# Patient Record
Sex: Female | Born: 1995 | Race: White | Hispanic: No | Marital: Single | Smoking: Current every day smoker
Health system: Southern US, Community
[De-identification: ages and names within clinical notes are randomized; demographics above are authoritative.]

---

## 2016-06-18 ENCOUNTER — Encounter (HOSPITAL_COMMUNITY): Payer: Self-pay

## 2016-06-18 ENCOUNTER — Emergency Department (HOSPITAL_COMMUNITY): Payer: Medicaid Other

## 2016-06-18 ENCOUNTER — Inpatient Hospital Stay (HOSPITAL_COMMUNITY): Payer: Medicaid Other

## 2016-06-18 ENCOUNTER — Inpatient Hospital Stay (HOSPITAL_COMMUNITY)
Admission: EM | Admit: 2016-06-18 | Discharge: 2016-06-23 | DRG: 853 | Discharge status: Against Medical Advice | Payer: Medicaid Other | Attending: Internal Medicine | Admitting: Internal Medicine

## 2016-06-18 DIAGNOSIS — F1990 Other psychoactive substance use, unspecified, uncomplicated: Secondary | ICD-10-CM | POA: Diagnosis not present

## 2016-06-18 DIAGNOSIS — E876 Hypokalemia: Secondary | ICD-10-CM | POA: Diagnosis present

## 2016-06-18 DIAGNOSIS — I959 Hypotension, unspecified: Secondary | ICD-10-CM

## 2016-06-18 DIAGNOSIS — R05 Cough: Secondary | ICD-10-CM | POA: Diagnosis present

## 2016-06-18 DIAGNOSIS — B1920 Unspecified viral hepatitis C without hepatic coma: Secondary | ICD-10-CM | POA: Diagnosis present

## 2016-06-18 DIAGNOSIS — D696 Thrombocytopenia, unspecified: Secondary | ICD-10-CM | POA: Diagnosis present

## 2016-06-18 DIAGNOSIS — A400 Sepsis due to streptococcus, group A: Principal | ICD-10-CM | POA: Diagnosis present

## 2016-06-18 DIAGNOSIS — R6520 Severe sepsis without septic shock: Secondary | ICD-10-CM | POA: Diagnosis not present

## 2016-06-18 DIAGNOSIS — A419 Sepsis, unspecified organism: Secondary | ICD-10-CM | POA: Diagnosis present

## 2016-06-18 DIAGNOSIS — B950 Streptococcus, group A, as the cause of diseases classified elsewhere: Secondary | ICD-10-CM | POA: Diagnosis not present

## 2016-06-18 DIAGNOSIS — E871 Hypo-osmolality and hyponatremia: Secondary | ICD-10-CM | POA: Diagnosis present

## 2016-06-18 DIAGNOSIS — R6521 Severe sepsis with septic shock: Secondary | ICD-10-CM | POA: Diagnosis present

## 2016-06-18 DIAGNOSIS — F17210 Nicotine dependence, cigarettes, uncomplicated: Secondary | ICD-10-CM | POA: Diagnosis not present

## 2016-06-18 DIAGNOSIS — M25421 Effusion, right elbow: Secondary | ICD-10-CM | POA: Diagnosis present

## 2016-06-18 DIAGNOSIS — L02413 Cutaneous abscess of right upper limb: Secondary | ICD-10-CM | POA: Diagnosis not present

## 2016-06-18 DIAGNOSIS — L03113 Cellulitis of right upper limb: Secondary | ICD-10-CM | POA: Diagnosis present

## 2016-06-18 DIAGNOSIS — F17200 Nicotine dependence, unspecified, uncomplicated: Secondary | ICD-10-CM | POA: Diagnosis present

## 2016-06-18 DIAGNOSIS — R21 Rash and other nonspecific skin eruption: Secondary | ICD-10-CM | POA: Diagnosis present

## 2016-06-18 DIAGNOSIS — Z5321 Procedure and treatment not carried out due to patient leaving prior to being seen by health care provider: Secondary | ICD-10-CM | POA: Diagnosis not present

## 2016-06-18 DIAGNOSIS — F1410 Cocaine abuse, uncomplicated: Secondary | ICD-10-CM | POA: Diagnosis present

## 2016-06-18 DIAGNOSIS — Z881 Allergy status to other antibiotic agents status: Secondary | ICD-10-CM | POA: Diagnosis not present

## 2016-06-18 DIAGNOSIS — Z880 Allergy status to penicillin: Secondary | ICD-10-CM

## 2016-06-18 DIAGNOSIS — F1910 Other psychoactive substance abuse, uncomplicated: Secondary | ICD-10-CM | POA: Diagnosis not present

## 2016-06-18 DIAGNOSIS — F1123 Opioid dependence with withdrawal: Secondary | ICD-10-CM | POA: Diagnosis present

## 2016-06-18 DIAGNOSIS — D649 Anemia, unspecified: Secondary | ICD-10-CM | POA: Diagnosis present

## 2016-06-18 DIAGNOSIS — M009 Pyogenic arthritis, unspecified: Secondary | ICD-10-CM | POA: Diagnosis not present

## 2016-06-18 DIAGNOSIS — M00221 Other streptococcal arthritis, right elbow: Secondary | ICD-10-CM | POA: Diagnosis present

## 2016-06-18 DIAGNOSIS — R233 Spontaneous ecchymoses: Secondary | ICD-10-CM

## 2016-06-18 DIAGNOSIS — Z452 Encounter for adjustment and management of vascular access device: Secondary | ICD-10-CM

## 2016-06-18 DIAGNOSIS — R7881 Bacteremia: Secondary | ICD-10-CM | POA: Diagnosis not present

## 2016-06-18 DIAGNOSIS — Z79899 Other long term (current) drug therapy: Secondary | ICD-10-CM | POA: Diagnosis not present

## 2016-06-18 LAB — I-STAT BETA HCG BLOOD, ED (MC, WL, AP ONLY)

## 2016-06-18 LAB — COMPREHENSIVE METABOLIC PANEL
ALT: 28 U/L (ref 14–54)
ANION GAP: 11 (ref 5–15)
AST: 44 U/L — ABNORMAL HIGH (ref 15–41)
Albumin: 2.3 g/dL — ABNORMAL LOW (ref 3.5–5.0)
Alkaline Phosphatase: 47 U/L (ref 38–126)
BUN: 5 mg/dL — ABNORMAL LOW (ref 6–20)
CHLORIDE: 88 mmol/L — AB (ref 101–111)
CO2: 26 mmol/L (ref 22–32)
Calcium: 7.7 mg/dL — ABNORMAL LOW (ref 8.9–10.3)
Creatinine, Ser: 0.83 mg/dL (ref 0.44–1.00)
Glucose, Bld: 112 mg/dL — ABNORMAL HIGH (ref 65–99)
POTASSIUM: 2.3 mmol/L — AB (ref 3.5–5.1)
Sodium: 125 mmol/L — ABNORMAL LOW (ref 135–145)
Total Bilirubin: 0.6 mg/dL (ref 0.3–1.2)
Total Protein: 5.7 g/dL — ABNORMAL LOW (ref 6.5–8.1)

## 2016-06-18 LAB — URINE MICROSCOPIC-ADD ON

## 2016-06-18 LAB — I-STAT CG4 LACTIC ACID, ED
LACTIC ACID, VENOUS: 2.22 mmol/L — AB (ref 0.5–1.9)
LACTIC ACID, VENOUS: 1.45 mmol/L (ref 0.5–1.9)

## 2016-06-18 LAB — URINALYSIS, ROUTINE W REFLEX MICROSCOPIC
Bilirubin Urine: NEGATIVE
Glucose, UA: NEGATIVE mg/dL
HGB URINE DIPSTICK: NEGATIVE
Ketones, ur: NEGATIVE mg/dL
Nitrite: NEGATIVE
Protein, ur: 100 mg/dL — AB
SPECIFIC GRAVITY, URINE: 1.019 (ref 1.005–1.030)
pH: 6 (ref 5.0–8.0)

## 2016-06-18 LAB — RAPID URINE DRUG SCREEN, HOSP PERFORMED
AMPHETAMINES: NOT DETECTED
BENZODIAZEPINES: NOT DETECTED
Barbiturates: NOT DETECTED
Cocaine: POSITIVE — AB
OPIATES: POSITIVE — AB
Tetrahydrocannabinol: NOT DETECTED

## 2016-06-18 LAB — CBC WITH DIFFERENTIAL/PLATELET
BASOS ABS: 0 10*3/uL (ref 0.0–0.1)
Basophils Relative: 0 %
EOS ABS: 0 10*3/uL (ref 0.0–0.7)
Eosinophils Relative: 0 %
HCT: 32.3 % — ABNORMAL LOW (ref 36.0–46.0)
HEMOGLOBIN: 11.1 g/dL — AB (ref 12.0–15.0)
LYMPHS ABS: 0.9 10*3/uL (ref 0.7–4.0)
Lymphocytes Relative: 10 %
MCH: 26.7 pg (ref 26.0–34.0)
MCHC: 34.4 g/dL (ref 30.0–36.0)
MCV: 77.8 fL — AB (ref 78.0–100.0)
Monocytes Absolute: 0.3 10*3/uL (ref 0.1–1.0)
Monocytes Relative: 4 %
NEUTROS PCT: 86 %
Neutro Abs: 7.8 10*3/uL — ABNORMAL HIGH (ref 1.7–7.7)
Platelets: 141 10*3/uL — ABNORMAL LOW (ref 150–400)
RBC: 4.15 MIL/uL (ref 3.87–5.11)
RDW: 13.4 % (ref 11.5–15.5)
WBC: 9 10*3/uL (ref 4.0–10.5)

## 2016-06-18 LAB — I-STAT TROPONIN, ED: TROPONIN I, POC: 0.04 ng/mL (ref 0.00–0.08)

## 2016-06-18 MED ORDER — DEXTROSE 5 % IV SOLN
2.0000 g | Freq: Once | INTRAVENOUS | Status: AC
  Administered 2016-06-18: 2 g via INTRAVENOUS
  Filled 2016-06-18: qty 2

## 2016-06-18 MED ORDER — DEXTROSE 5 % IV SOLN
1.0000 g | Freq: Three times a day (TID) | INTRAVENOUS | Status: DC
  Administered 2016-06-19: 1 g via INTRAVENOUS
  Filled 2016-06-18 (×3): qty 1

## 2016-06-18 MED ORDER — SODIUM CHLORIDE 0.9 % IV BOLUS (SEPSIS)
250.0000 mL | Freq: Once | INTRAVENOUS | Status: AC
  Administered 2016-06-18: 500 mL via INTRAVENOUS

## 2016-06-18 MED ORDER — SODIUM CHLORIDE 0.9 % IV BOLUS (SEPSIS)
1000.0000 mL | Freq: Once | INTRAVENOUS | Status: AC
  Administered 2016-06-18: 1000 mL via INTRAVENOUS

## 2016-06-18 MED ORDER — SODIUM CHLORIDE 0.9 % IV SOLN
30.0000 meq | Freq: Once | INTRAVENOUS | Status: AC
  Administered 2016-06-18: 30 meq via INTRAVENOUS
  Filled 2016-06-18: qty 15

## 2016-06-18 MED ORDER — NOREPINEPHRINE BITARTRATE 1 MG/ML IV SOLN
0.0000 ug/min | Freq: Once | INTRAVENOUS | Status: AC
  Administered 2016-06-19: 5 ug/min via INTRAVENOUS
  Filled 2016-06-18 (×2): qty 4

## 2016-06-18 MED ORDER — LEVOFLOXACIN IN D5W 750 MG/150ML IV SOLN
750.0000 mg | Freq: Once | INTRAVENOUS | Status: AC
  Administered 2016-06-18: 750 mg via INTRAVENOUS
  Filled 2016-06-18: qty 150

## 2016-06-18 MED ORDER — VANCOMYCIN HCL IN DEXTROSE 750-5 MG/150ML-% IV SOLN
750.0000 mg | Freq: Two times a day (BID) | INTRAVENOUS | Status: DC
  Filled 2016-06-18 (×2): qty 150

## 2016-06-18 MED ORDER — SODIUM CHLORIDE 0.9 % IV BOLUS (SEPSIS)
500.0000 mL | Freq: Once | INTRAVENOUS | Status: AC
  Administered 2016-06-18: 500 mL via INTRAVENOUS

## 2016-06-18 MED ORDER — LEVOFLOXACIN IN D5W 750 MG/150ML IV SOLN
750.0000 mg | INTRAVENOUS | Status: DC
  Filled 2016-06-18: qty 150

## 2016-06-18 MED ORDER — POTASSIUM CHLORIDE CRYS ER 20 MEQ PO TBCR
40.0000 meq | EXTENDED_RELEASE_TABLET | Freq: Once | ORAL | Status: AC
  Administered 2016-06-18: 40 meq via ORAL
  Filled 2016-06-18: qty 2

## 2016-06-18 MED ORDER — VANCOMYCIN HCL IN DEXTROSE 1-5 GM/200ML-% IV SOLN
1000.0000 mg | Freq: Once | INTRAVENOUS | Status: AC
  Administered 2016-06-18: 1000 mg via INTRAVENOUS
  Filled 2016-06-18: qty 200

## 2016-06-18 MED ORDER — IOPAMIDOL (ISOVUE-300) INJECTION 61%
INTRAVENOUS | Status: AC
  Administered 2016-06-18: 100 mL
  Filled 2016-06-18: qty 100

## 2016-06-18 MED ORDER — ACETAMINOPHEN 500 MG PO TABS
1000.0000 mg | ORAL_TABLET | Freq: Once | ORAL | Status: DC
  Filled 2016-06-18: qty 2

## 2016-06-18 MED ORDER — SODIUM CHLORIDE 0.9% FLUSH
3.0000 mL | Freq: Two times a day (BID) | INTRAVENOUS | Status: DC
  Administered 2016-06-19 – 2016-06-20 (×2): 3 mL via INTRAVENOUS

## 2016-06-18 MED ORDER — ENOXAPARIN SODIUM 40 MG/0.4ML ~~LOC~~ SOLN
40.0000 mg | SUBCUTANEOUS | Status: DC
  Administered 2016-06-20 – 2016-06-23 (×3): 40 mg via SUBCUTANEOUS
  Filled 2016-06-18 (×5): qty 0.4

## 2016-06-18 NOTE — ED Notes (Signed)
Lab called Potassium 2.3.  PA made aware.

## 2016-06-18 NOTE — ED Triage Notes (Signed)
To room via EMS.  Onset 1 week non productive cough.  Onset 1 week right forearm swelling and pain and reports started after shooting heroin.  Initially there was redness in this area, no redness noted now. Pt unable to straighten out right arm d/t pain.  Pt last used heroin 3 days ago.  Pt has had vomiting and diarrhea x 3 days, pt thinks this is due to withdrawals.  Onset 1 hour PTA widespread pink rash, slightly raised, and itching.  EMS gave Benadryl 50 mg - pt c/o no itching at this tim.  EMS gave Tylenol 1000 mg for temp 101.1 temporal.  Pt is homeless and reports she stayed with an elderly couple in their hotel room last night.

## 2016-06-18 NOTE — ED Notes (Signed)
[facility] contacted to activate Code Sepsis 

## 2016-06-18 NOTE — Consult Note (Signed)
PULMONARY / CRITICAL CARE MEDICINE   Name: Megan Farmer MRN: [id number] DOB: 28-Apr-1996    ADMISSION DATE:  06/18/2016 CONSULTATION DATE:  06/18/16  REFERRING MD:  ED physicican  CHIEF COMPLAINT:  Septic shock  HISTORY OF PRESENT ILLNESS:   Megan Farmer is a 20 y/o woman with a history of IV drug use, injects heroin and smokes cocaine.  Patient presents to the ED with complaint of right elbow pain, rash, cough and congestion, fever. Pt states approximately a week ago she injected right AC with crack cocaine "wash." She explains that is when a cotton is soaked and crack cocaine and vinegar, and then this mixture is drawn and injected. Patient states in her case this was old and she just had leftovers from a couple of days that she drew up and injected. She states since then she developed pain and swelling to the right elbow. She has pain with full extension of the joint. She is also reporting cough and congestion with green productive sputum for 1-2 weeks. She states today she broke out in a rash in her extremities. She believes she developed fever at some point today as well. She has been staying in a hotel with an elderly couple that picked her up that has dogs. States rash is itchy. She was given Benadryl by EMS prior to coming in. She reports no prior medical problems. She denies taking any medications prior to coming in.  Pt remained hypotensive despite 5L IV fluids and was eventually started on levophed and admitted to the ICU.   PAST MEDICAL HISTORY :  Previously healthy PAST SURGICAL HISTORY: No past surgery  Allergies  Allergen Reactions  . Amoxicillin Swelling  . Penicillins Swelling    No current facility-administered medications on file prior to encounter.    No current outpatient prescriptions on file prior to encounter.    FAMILY HISTORY:  Her has no family status information on file.    SOCIAL HISTORY: She  reports that she has been smoking.  She has never  used smokeless tobacco. She reports that she uses drugs, including IV and Cocaine. She reports that she does not drink alcohol.  REVIEW OF SYSTEMS:   A review of 14 systems was negative except as stated in the HPI.   SUBJECTIVE:  20 y/o with fluid refractory shock requiring pressors 2/2 soft tissue infection of the right arm.   VITAL SIGNS: BP (!) 78/48   Pulse 119   Temp 100.8 F (38.2 C) (Rectal)   Resp 25   Wt 54.4 kg (120 lb)   LMP 05/29/2016   SpO2 97%   HEMODYNAMICS:    VENTILATOR SETTINGS:    INTAKE / OUTPUT: No intake/output data recorded.  PHYSICAL EXAMINATION: General:  Laying in bed, no acute distress but quite anxious Neuro:  A&Ox3 HEENT:  PERRL, EOMI, OP clear Cardiovascular:  Tachycardic, regular rhythm, no MRG Lungs:  Scattered rhonchi Abdomen:  Soft, NTND, +BS Musculoskeletal:  No edema, right forearm swollen Skin:  Multiple bruises and injection sites.  LABS:  BMET  Recent Labs Lab 06/18/16 2000  NA 125*  K 2.3*  CL 88*  CO2 26  BUN 5*  CREATININE 0.83  GLUCOSE 112*    Electrolytes  Recent Labs Lab 06/18/16 2000  CALCIUM 7.7*    CBC  Recent Labs Lab 06/18/16 2000  WBC 9.0  HGB 11.1*  HCT 32.3*  PLT 141*    Coag's No results for input(s): APTT, INR in the last 168 hours.  Sepsis Markers  Recent Labs Lab 06/18/16 2006 06/18/16 2251  LATICACIDVEN 2.22* 1.45    ABG No results for input(s): PHART, PCO2ART, PO2ART in the last 168 hours.  Liver Enzymes  Recent Labs Lab 06/18/16 2000  AST 44*  ALT 28  ALKPHOS 47  BILITOT 0.6  ALBUMIN 2.3*    Cardiac Enzymes No results for input(s): TROPONINI, PROBNP in the last 168 hours.  Glucose No results for input(s): GLUCAP in the last 168 hours.  Imaging Dg Chest 2 View  Result Date: 06/18/2016 CLINICAL DATA:  Pt has had congestion with a productive cough w/yellowish phlegm. She was trying to inject crack intraveniously and missed her vein. She said her whole  elbow feels tight and she's unable to straighten her arm out. EXAM: CHEST  2 VIEW COMPARISON:  None. FINDINGS: The heart size and mediastinal contours are within normal limits. Both lungs are clear. No pleural effusion or pneumothorax. The visualized skeletal structures are unremarkable. IMPRESSION: No active cardiopulmonary disease. Electronically Signed   By: Amie Portlandavid  Ormond M.D.   On: 06/18/2016 21:17   Dg Elbow Complete Right  Result Date: 06/18/2016 CLINICAL DATA:  Pt has had congestion with a productive cough w/yellowish phlegm. She was trying to inject crack intraveniously and missed her vein. She said her whole elbow feels tight and she's unable to straighten her arm out. EXAM: RIGHT ELBOW - COMPLETE 3+ VIEW COMPARISON:  None. FINDINGS: No fracture.  No bone lesion. The elbow joint is normally spaced and aligned. No arthropathic change. No joint effusion. There is subcutaneous soft tissue edema/cellulitis most evident along the dorsal aspect of the elbow and proximal forearm. No soft tissue air. IMPRESSION: 1. No fracture, bone lesion or elbow joint abnormality. 2. Soft tissue edema/cellulitis. Electronically Signed   By: Amie Portlandavid  Ormond M.D.   On: 06/18/2016 21:18   Ct Eblow Right W Contrast  Result Date: 06/18/2016 CLINICAL DATA:  Acute onset of right elbow pain and swelling. Initial encounter. EXAM: CT OF THE UPPER RIGHT EXTREMITY WITH CONTRAST TECHNIQUE: Multidetector CT imaging of the upper right extremity was performed according to the standard protocol following intravenous contrast administration. COMPARISON:  None. CONTRAST:  100mL ISOVUE-300 IOPAMIDOL (ISOVUE-300) INJECTION 61% FINDINGS: Bones/Joint/Cartilage There is no evidence of fracture or dislocation. No osseous erosion is seen. Note is made of a right elbow joint effusion, with mild peripheral enhancement, raising suspicion for septic joint. Ligaments Suboptimally assessed by CT. Muscles and Tendons An apparent tiny 1.2 x 0.8 cm  intramuscular abscess is seen within the superficial musculature at the level of the antecubital fossa. The visualized musculature is otherwise grossly unremarkable. The visualized tendon structures are grossly unremarkable. Soft tissues Mild soft tissue edema is seen tracking along the dorsum of the forearm and elbow. There appears to be diffuse thrombophlebitis involving multiple veins at and above the antecubital fossa. IMPRESSION: 1. No evidence of fracture or dislocation. No evidence of osseous erosion. 2. Right elbow joint effusion, with mild peripheral enhancement, raising suspicion for septic joint. 3. Apparent tiny 1.2 cm intramuscular abscess within the superficial musculature at the level of the antecubital fossa. 4. Diffuse thrombophlebitis involving multiple veins at and above the antecubital fossa. 5. Mild soft tissue edema tracking along the dorsum of the forearm and elbow. Electronically Signed   By: Roanna RaiderJeffery  Chang M.D.   On: 06/18/2016 22:40     STUDIES:  CT right elbow - soft tissue swelling, small abscess, elbow joint effusion   CULTURES: Blood  ANTIBIOTICS: Aztreonam 06/18/16 --->  Vanc 06/18/16 --> Levaquin 06/18/16 -->  SIGNIFICANT EVENTS:   LINES/TUBES: Left subclavian TLC 06/19/16  DISCUSSION: 20 y/o IVDU with fluid refractory shock 2/2 soft tissue infection of right arm.  ASSESSMENT / PLAN:  PULMONARY A: Productive cough P:   No evidence of pna on CXR O2 by nasal canula to maintain sats >92%  CARDIOVASCULAR A:  Tachycardia, hypotension P:  2/2 sepsis and opioid withdrawal Levophed to maintain MAP >65  GASTROINTESTINAL A:   H/o vomiting  P:   Likely 2/2 withdrawal Treat symptomatically with antiemetics  HEMATOLOGIC A:   Anemia, thrombocytopenia P:  Likely 2/2 to infection Follow  INFECTIOUS A:   Soft tissue infection of right arm P:   Seen and evaluated by ortho - may go to OR today, they will re-evaluate this AM, currently arm is  elevated Vanc, Aztrenam and levaquin (penicillin allergy) pending culture results   FAMILY  - Updates: Pt does not want her family contacted at this time.  - Inter-disciplinary family meet or Palliative Care meeting due by:  06/25/16   Sandi Carne, MD, PhD Pulmonary and Critical Care Medicine [facility] Pager: [phone]  06/18/2016, 11:43 PM

## 2016-06-18 NOTE — Progress Notes (Signed)
Pharmacy Antibiotic Note  Megan Farmer is a 20 y.o. female admitted on 06/18/2016 with sepsis.  Pharmacy has been consulted for vancomycin, levaquin and aztreonam dosing. Pt with Tmax 103.2 and WBC is WNL. Scr is WNL. Lactic acid is elevated at 2.22.   Plan: - Vancomycin 1gm IV x 1 then 750mg  IV Q12H - Aztreonam 1gm IV Q8H - Levaquin 750mg  IV Q24H - F/u renal fxn, C&S, clinical status and trough at SS  Weight: 120 lb (54.4 kg)  Temp (24hrs), Avg:103.2 F (39.6 C), Min:103.2 F (39.6 C), Max:103.2 F (39.6 C)   Recent Labs Lab 06/18/16 2000 06/18/16 2006  WBC 9.0  --   CREATININE 0.83  --   LATICACIDVEN  --  2.22*    CrCl cannot be calculated (Unknown ideal weight.).    Allergies  Allergen Reactions  . Amoxicillin Swelling  . Penicillins Swelling    Antimicrobials this admission: Vanc 12/2>> Levaquin 12/2>> Aztreonam 12/2>>  Dose adjustments this admission: N/A  Microbiology results: Pending  Thank you for allowing pharmacy to be a part of this patient's care.  Rayen Dafoe, Drake LeachRachel Lynn 06/18/2016 8:48 PM

## 2016-06-18 NOTE — ED Provider Notes (Signed)
Patient complains of cough productive of green sputum for one week. She also complains of right elbow pain at the medial aspect and to fully extend elbow for the past 3 days since she injected into her right medial elbow 3 days ago. On exam patient is alert Glasgow Coma Score 15 ill-appearing skin diffuse petechial rash neck supple heart tachycardic regular rhythm no murmurs left upper extremity swollen and it elbow she is unable to extend past 90. Radial pulse 2+. Good capillary refill.   Doug SouSam Keyonta Madrid, MD 06/18/16 [phone]

## 2016-06-18 NOTE — ED Notes (Signed)
Report attempted 

## 2016-06-18 NOTE — ED Provider Notes (Signed)
[facility]-EMERGENCY DEPT Provider Note   CSN: [id number] Arrival date & time: 06/18/16  1937     History   Chief Complaint Chief Complaint  Patient presents with  . Cough  . Fever    HPI Megan Farmer is a 20 y.o. female.  HPI Megan Farmer is a 20 y.o. female with no medical problems, presents to ED with complaint of right elbow pain, rash, cough and congestion, fever. Pt states approximately a week ago she injected right AC with crack cocaine "wash." She explains that is when a cotton is soaked and crack cocaine and vinegar, and then this mixture is drawn and injected. Patient states in her case this was old and she just had leftovers from a couple of days that she drew up and injected. She states since then she developed pain and swelling to the right elbow. She has pain with full extension of the joint. She is also reporting cough and congestion with green productive sputum for 1-2 weeks. She states today she broke out in a rash in her extremities. She believes she developed fever at some point today as well. She has been staying in a hotel with an elderly couple that picked her up that has dogs. States rash is itchy. She was given Benadryl by EMS prior to coming in. She reports no prior medical problems. She denies taking any medications prior to coming in.  History reviewed. No pertinent past medical history.  There are no active problems to display for this patient.   History reviewed. No pertinent surgical history.  OB History    No data available       Home Medications    Prior to Admission medications   Not on File    Family History History reviewed. No pertinent family history.  Social History Social History  Substance Use Topics  . Smoking status: Current Every Day Smoker  . Smokeless tobacco: Never Used     Comment: Doesn't have money to buy, will smoke when someone gives them to her.   . Alcohol use No     Allergies   Amoxicillin and  Penicillins   Review of Systems Review of Systems  Constitutional: Positive for chills and fever.  Respiratory: Positive for cough. Negative for chest tightness and shortness of breath.   Cardiovascular: Negative for chest pain, palpitations and leg swelling.  Gastrointestinal: Negative for abdominal pain, diarrhea, nausea and vomiting.  Genitourinary: Negative for dysuria, flank pain, pelvic pain, vaginal bleeding, vaginal discharge and vaginal pain.  Musculoskeletal: Positive for arthralgias, joint swelling and myalgias. Negative for neck pain and neck stiffness.  Skin: Positive for rash.  Neurological: Negative for dizziness, weakness and headaches.  All other systems reviewed and are negative.    Physical Exam Updated Vital Signs BP 104/68   Pulse (!) 139   Temp (!) 103.2 F (39.6 C) (Rectal)   Resp 22   LMP 05/29/2016   SpO2 94%   Physical Exam  Constitutional: She appears well-developed and well-nourished. No distress.  HENT:  Head: Normocephalic.  Lips Dry, oral mucosa dry  Eyes: Conjunctivae are normal.  Neck: Neck supple.  Cardiovascular: Regular rhythm and normal heart sounds.   Tachycardic. No murmur  Pulmonary/Chest: Effort normal and breath sounds normal. No respiratory distress. She has no wheezes. She has no rales.  Abdominal: Soft. Bowel sounds are normal. She exhibits no distension. There is no tenderness. There is no rebound.  Musculoskeletal: She exhibits no edema.  Swelling diffusely to the right elbow.  Diffuse tenderness to palpation. No erythema or warmth to the touch. Patient is holding her right elbow in 90 flexed position. She is able to fully flex the elbow, unable to extend fully. No skin cellulitis noted.  Neurological: She is alert.  Skin: Skin is warm and dry.  Rash to bilateral upper and lower extremities. Rash to the left arm petechial, nonblanching, flat. The rash to bilateral legs and right arm looks slightly different, it is blanching,  erythematous, papular, raised.  Psychiatric: She has a normal mood and affect. Her behavior is normal.  Nursing note and vitals reviewed.    ED Treatments / Results  Labs (all labs ordered are listed, but only abnormal results are displayed) Labs Reviewed  COMPREHENSIVE METABOLIC PANEL - Abnormal; Notable for the following:       Result Value   Sodium 125 (*)    Potassium 2.3 (*)    Chloride 88 (*)    Glucose, Bld 112 (*)    BUN 5 (*)    Calcium 7.7 (*)    Total Protein 5.7 (*)    Albumin 2.3 (*)    AST 44 (*)    All other components within normal limits  CBC WITH DIFFERENTIAL/PLATELET - Abnormal; Notable for the following:    Hemoglobin 11.1 (*)    HCT 32.3 (*)    MCV 77.8 (*)    Platelets 141 (*)    Neutro Abs 7.8 (*)    All other components within normal limits  I-STAT CG4 LACTIC ACID, ED - Abnormal; Notable for the following:    Lactic Acid, Venous 2.22 (*)    All other components within normal limits  CULTURE, BLOOD (ROUTINE X 2)  CULTURE, BLOOD (ROUTINE X 2)  URINE CULTURE  URINALYSIS, ROUTINE W REFLEX MICROSCOPIC (NOT [facility])  RAPID URINE DRUG SCREEN, HOSP PERFORMED  I-STAT TROPOININ, ED  I-STAT BETA HCG BLOOD, ED ([facility], [facility], [facility] ONLY)    EKG  EKG Interpretation  Date/Time:  Saturday June 18 2016 19:47:42 EST Ventricular Rate:  144 PR Interval:    QRS Duration: 84 QT Interval:  311 QTC Calculation: 482 R Axis:   34 Text Interpretation:  Sinus tachycardia Ventricular premature complex Borderline repolarization abnormality Borderline prolonged QT interval No old tracing to compare Confirmed by Ethelda ChickJACUBOWITZ  MD, SAM [phone]([id number]) on 06/18/2016 8:10:22 PM       Radiology Dg Chest 2 View  Result Date: 06/18/2016 CLINICAL DATA:  Pt has had congestion with a productive cough w/yellowish phlegm. She was trying to inject crack intraveniously and missed her vein. She said her whole elbow feels tight and she's unable to straighten her arm out. EXAM: CHEST  2 VIEW  COMPARISON:  None. FINDINGS: The heart size and mediastinal contours are within normal limits. Both lungs are clear. No pleural effusion or pneumothorax. The visualized skeletal structures are unremarkable. IMPRESSION: No active cardiopulmonary disease. Electronically Signed   By: Amie Portlandavid  Ormond M.D.   On: 06/18/2016 21:17   Dg Elbow Complete Right  Result Date: 06/18/2016 CLINICAL DATA:  Pt has had congestion with a productive cough w/yellowish phlegm. She was trying to inject crack intraveniously and missed her vein. She said her whole elbow feels tight and she's unable to straighten her arm out. EXAM: RIGHT ELBOW - COMPLETE 3+ VIEW COMPARISON:  None. FINDINGS: No fracture.  No bone lesion. The elbow joint is normally spaced and aligned. No arthropathic change. No joint effusion. There is subcutaneous soft tissue edema/cellulitis most evident along the dorsal  aspect of the elbow and proximal forearm. No soft tissue air. IMPRESSION: 1. No fracture, bone lesion or elbow joint abnormality. 2. Soft tissue edema/cellulitis. Electronically Signed   By: Amie Portlandavid  Ormond M.D.   On: 06/18/2016 21:18    Procedures Procedures (including critical care time)  Medications Ordered in ED Medications  sodium chloride 0.9 % bolus 1,000 mL (1,000 mLs Intravenous New Bag/Given 06/18/16 2041)    And  sodium chloride 0.9 % bolus 500 mL (not administered)    And  sodium chloride 0.9 % bolus 250 mL (not administered)  levofloxacin (LEVAQUIN) IVPB 750 mg (750 mg Intravenous New Bag/Given 06/18/16 2128)  aztreonam (AZACTAM) 2 g in dextrose 5 % 50 mL IVPB (2 g Intravenous New Bag/Given 06/18/16 2133)  vancomycin (VANCOCIN) IVPB 1000 mg/200 mL premix (not administered)  acetaminophen (TYLENOL) tablet 1,000 mg (1,000 mg Oral Not Given 06/18/16 2131)  potassium chloride 30 mEq in sodium chloride 0.9 % 265 mL (KCL MULTIRUN) IVPB (30 mEq Intravenous Given 06/18/16 2121)  potassium chloride SA (K-DUR,KLOR-CON) CR tablet 40 mEq (not  administered)  vancomycin (VANCOCIN) IVPB 750 mg/150 ml premix (not administered)  levofloxacin (LEVAQUIN) IVPB 750 mg (not administered)  aztreonam (AZACTAM) 1 g in dextrose 5 % 50 mL IVPB (not administered)  iopamidol (ISOVUE-300) 61 % injection (not administered)     Initial Impression / Assessment and Plan / ED Course  I have reviewed the triage vital signs and the nursing notes.  Pertinent labs & imaging results that were available during my care of the patient were reviewed by me and considered in my medical decision making (see chart for details).  Clinical Course    8:37 PM  Patient in emergency department with fever, right elbow pain and swelling, rash, cough. She is febrile at 103.2, heart rate is in 130s. Patient meets SIRS criteria and code Sepsis will be called. Suspect the source of infection could be right elbow infection versus pneumonia versus endocarditis given petechial rash. Blood pressure at this time is 104/68. Respiratory rate is 22. Will give her fluids, and antibiotics, will monitor closely.  9:34 PM Chest x-ray negative. Right elbow soft tissue swelling on xray, possible cellulitis. Still question septic joint. Discussed with Dr. Sherrie MustacheJacubuwitz, will get CT elbow. Pt continues to be tachycardic, tachypnec, she is receiving fluids. Potassium 2.3, given 40mEq PO and 30mEq IV. She is receiving antibiotics.   Spoke with Dr. Julian ReilGardner with triad, will admit.   Sepsis - Repeat Assessment  Performed at:    9:38 PM  Vitals     Blood pressure 93/59, pulse (!) 131, temperature (!) 103.2 F (39.6 C), temperature source Rectal, resp. rate (!) 31, weight 54.4 kg, last menstrual period 05/29/2016, SpO2 94 %.  Heart:     Tachycardic  Lungs:    Wheezing  Capillary Refill:   <2 sec  Peripheral Pulse:   Radial pulse palpable and Dorsalis pedis pulse  palpable  Skin:     Normal Color   CRITICAL CARE Performed by: Jaynie CrumbleKIRICHENKO, Dalynn Jhaveri A Total critical care time: 30  minutes Critical care time was exclusive of separately billable procedures and treating other patients. Critical care was necessary to treat or prevent imminent or life-threatening deterioration. Critical care was time spent personally by me on the following activities: development of treatment plan with patient and/or surrogate as well as nursing, discussions with consultants, evaluation of patient's response to treatment, examination of patient, obtaining history from patient or surrogate, ordering and performing treatments and interventions, ordering and  review of laboratory studies, ordering and review of radiographic studies, pulse oximetry and re-evaluation of patient's condition.   Final Clinical Impressions(s) / ED  Diagnoses   Final diagnoses:  None    New Prescriptions New Prescriptions   No medications on file     Jaynie Crumble, PA-C 06/19/16 0505    Doug Sou, MD 06/20/16 1610

## 2016-06-18 NOTE — ED Notes (Signed)
Pt requesting that no one know that she is a patient at hospital and does not want to call anyone to advise that she is in hospital.

## 2016-06-18 NOTE — ED Notes (Signed)
Patient transported to CT 

## 2016-06-18 NOTE — Progress Notes (Signed)
BP drop to 80s despite ongoing initial IVF bolus: will add additional boluses as needed up to 6645ml/kg-60ml/kg at max and then will call PCCM for pressors at that point if she continues to deteriorate as I fear.

## 2016-06-18 NOTE — H&P (Addendum)
History and Physical    Megan Farmer ZOX:[id number] DOB: 03/09/1996 DOA: 06/18/2016   PCP: No primary care provider on file. Chief Complaint:  Chief Complaint  Patient presents with  . Cough  . Fever    HPI: Megan Farmer is a 20 y.o. female with medical history significant of IVDU, injects heroin and smokes cocaine.  Patient presents to the ED with complaint of right elbow pain, rash, cough and congestion, fever. Pt states approximately a week ago she injected right AC with crack cocaine "wash." She explains that is when a cotton is soaked and crack cocaine and vinegar, and then this mixture is drawn and injected. Patient states in her case this was old and she just had leftovers from a couple of days that she drew up and injected. She states since then she developed pain and swelling to the right elbow. She has pain with full extension of the joint. She is also reporting cough and congestion with green productive sputum for 1-2 weeks. She states today she broke out in a rash in her extremities. She believes she developed fever at some point today as well. She has been staying in a hotel with an elderly couple that picked her up that has dogs. States rash is itchy. She was given Benadryl by EMS prior to coming in. She reports no prior medical problems. She denies taking any medications prior to coming in.  ED Course: In the ED patient is clearly septic with Tm 103.2, tachycardic to 140s, has whole body peticheal rash.  Review of Systems: As per HPI otherwise 10 point review of systems negative.    History reviewed. No pertinent past medical history.  History reviewed. No pertinent surgical history.   reports that she has been smoking.  She has never used smokeless tobacco. She reports that she uses drugs, including IV and Cocaine. She reports that she does not drink alcohol.  Allergies  Allergen Reactions  . Amoxicillin Swelling  . Penicillins Swelling    History reviewed.  No pertinent family history.  Patient is estranged from family at this point due to drug use.   Prior to Admission medications   Not on File    Physical Exam: Vitals:   06/18/16 2040 06/18/16 2045 06/18/16 2145 06/18/16 2159  BP:  93/59 (!) 91/51   Pulse:  (!) 131 (!) 134   Resp:  (!) 31 (!) 28   Temp:    100.8 F (38.2 C)  TempSrc:    Rectal  SpO2:  94% 96%   Weight: 54.4 kg (120 lb)         Constitutional: NAD, calm, comfortable Eyes: PERRL, lids and conjunctivae normal ENMT: Mucous membranes are moist. Posterior pharynx clear of any exudate or lesions.Normal dentition.  Neck: normal, supple, no masses, no thyromegaly Respiratory: clear to auscultation bilaterally, no wheezing, no crackles. Normal respiratory effort. No accessory muscle use.  Cardiovascular: Regular rate and rhythm, no murmurs / rubs / gallops. No extremity edema. 2+ pedal pulses. No carotid bruits.  Abdomen: no tenderness, no masses palpated. No hepatosplenomegaly. Bowel sounds positive.  Musculoskeletal: no clubbing / cyanosis. No joint deformity upper and lower extremities. Good ROM, no contractures. Normal muscle tone.  Skin: whole body petechial rash Neurologic: CN 2-12 grossly intact. Sensation intact, DTR normal. Strength 5/5 in all 4.  Psychiatric: Normal judgment and insight. Alert and oriented x 3. Normal mood.    Labs on Admission: I have personally reviewed following labs and imaging studies  CBC:  Recent Labs Lab 06/18/16 2000  WBC 9.0  NEUTROABS 7.8*  HGB 11.1*  HCT 32.3*  MCV 77.8*  PLT 141*   Basic Metabolic Panel:  Recent Labs Lab 06/18/16 2000  NA 125*  K 2.3*  CL 88*  CO2 26  GLUCOSE 112*  BUN 5*  CREATININE 0.83  CALCIUM 7.7*   GFR: CrCl cannot be calculated (Unknown ideal weight.). Liver Function Tests:  Recent Labs Lab 06/18/16 2000  AST 44*  ALT 28  ALKPHOS 47  BILITOT 0.6  PROT 5.7*  ALBUMIN 2.3*   No results for input(s): LIPASE, AMYLASE in the  last 168 hours. No results for input(s): AMMONIA in the last 168 hours. Coagulation Profile: No results for input(s): INR, PROTIME in the last 168 hours. Cardiac Enzymes: No results for input(s): CKTOTAL, CKMB, CKMBINDEX, TROPONINI in the last 168 hours. BNP (last 3 results) No results for input(s): PROBNP in the last 8760 hours. HbA1C: No results for input(s): HGBA1C in the last 72 hours. CBG: No results for input(s): GLUCAP in the last 168 hours. Lipid Profile: No results for input(s): CHOL, HDL, LDLCALC, TRIG, CHOLHDL, LDLDIRECT in the last 72 hours. Thyroid Function Tests: No results for input(s): TSH, T4TOTAL, FREET4, T3FREE, THYROIDAB in the last 72 hours. Anemia Panel: No results for input(s): VITAMINB12, FOLATE, FERRITIN, TIBC, IRON, RETICCTPCT in the last 72 hours. Urine analysis: No results found for: COLORURINE, APPEARANCEUR, LABSPEC, PHURINE, GLUCOSEU, HGBUR, BILIRUBINUR, KETONESUR, PROTEINUR, UROBILINOGEN, NITRITE, LEUKOCYTESUR Sepsis Labs: @LABRCNTIP (procalcitonin:4,lacticidven:4) )No results found for this or any previous visit (from the past 240 hour(s)).   Radiological Exams on Admission: Dg Chest 2 View  Result Date: 06/18/2016 CLINICAL DATA:  Pt has had congestion with a productive cough w/yellowish phlegm. She was trying to inject crack intraveniously and missed her vein. She said her whole elbow feels tight and she's unable to straighten her arm out. EXAM: CHEST  2 VIEW COMPARISON:  None. FINDINGS: The heart size and mediastinal contours are within normal limits. Both lungs are clear. No pleural effusion or pneumothorax. The visualized skeletal structures are unremarkable. IMPRESSION: No active cardiopulmonary disease. Electronically Signed   By: Amie Portlandavid  Ormond M.D.   On: 06/18/2016 21:17   Dg Elbow Complete Right  Result Date: 06/18/2016 CLINICAL DATA:  Pt has had congestion with a productive cough w/yellowish phlegm. She was trying to inject crack intraveniously  and missed her vein. She said her whole elbow feels tight and she's unable to straighten her arm out. EXAM: RIGHT ELBOW - COMPLETE 3+ VIEW COMPARISON:  None. FINDINGS: No fracture.  No bone lesion. The elbow joint is normally spaced and aligned. No arthropathic change. No joint effusion. There is subcutaneous soft tissue edema/cellulitis most evident along the dorsal aspect of the elbow and proximal forearm. No soft tissue air. IMPRESSION: 1. No fracture, bone lesion or elbow joint abnormality. 2. Soft tissue edema/cellulitis. Electronically Signed   By: Amie Portlandavid  Ormond M.D.   On: 06/18/2016 21:18    EKG: Independently reviewed.  Assessment/Plan Principal Problem:   Sepsis associated hypotension (HCC) Active Problems:   IVDU (intravenous drug user)    1. Sepsis - likely bacteremia from IVDU, may also have R elbow infection 1. CT R elbow pending 2. IVF getting 30 ml/kg then 125 cc/hr 3. BCx ordered and pending, if positive, then likely needs TEE for endocarditis as she is high risk 4. Aztreonam, levaquin, and vanc empirically   DVT prophylaxis: Lovenox Code Status: Full Family Communication: No family in room Consults called: None  Admission status: Admit to inpatient   Hillary BowGARDNER, Zivah Mayr M. DO [facility] Pager [phone] from 7PM-7AM  If 7AM-7PM, please contact the day physician for the patient [email or web address] Password [email or web address]  06/18/2016, 10:04 PM

## 2016-06-18 NOTE — ED Notes (Signed)
Angela Burkealled Angela RN @ 4E to advise pt transport on hold per Dr. Julian ReilGardner, awaiting critical care consult.

## 2016-06-19 ENCOUNTER — Inpatient Hospital Stay (HOSPITAL_COMMUNITY): Payer: Medicaid Other

## 2016-06-19 ENCOUNTER — Inpatient Hospital Stay (HOSPITAL_COMMUNITY): Payer: Medicaid Other | Admitting: Anesthesiology

## 2016-06-19 ENCOUNTER — Encounter (HOSPITAL_COMMUNITY)
Admission: EM | Discharge status: Against Medical Advice | Payer: Self-pay | Source: Home / Self Care | Attending: Internal Medicine

## 2016-06-19 DIAGNOSIS — R6521 Severe sepsis with septic shock: Secondary | ICD-10-CM

## 2016-06-19 DIAGNOSIS — A419 Sepsis, unspecified organism: Secondary | ICD-10-CM | POA: Diagnosis present

## 2016-06-19 DIAGNOSIS — F1990 Other psychoactive substance use, unspecified, uncomplicated: Secondary | ICD-10-CM

## 2016-06-19 DIAGNOSIS — L03113 Cellulitis of right upper limb: Secondary | ICD-10-CM

## 2016-06-19 HISTORY — PX: I & D EXTREMITY: SHX5045

## 2016-06-19 LAB — CBC
HEMATOCRIT: 32 % — AB (ref 36.0–46.0)
HEMOGLOBIN: 10.7 g/dL — AB (ref 12.0–15.0)
MCH: 26 pg (ref 26.0–34.0)
MCHC: 33.4 g/dL (ref 30.0–36.0)
MCV: 77.9 fL — ABNORMAL LOW (ref 78.0–100.0)
Platelets: 107 10*3/uL — ABNORMAL LOW (ref 150–400)
RBC: 4.11 MIL/uL (ref 3.87–5.11)
RDW: 14 % (ref 11.5–15.5)
WBC: 8 10*3/uL (ref 4.0–10.5)

## 2016-06-19 LAB — MRSA PCR SCREENING: MRSA BY PCR: NEGATIVE

## 2016-06-19 LAB — BLOOD CULTURE ID PANEL (REFLEXED)
Acinetobacter baumannii: NOT DETECTED
CANDIDA GLABRATA: NOT DETECTED
CANDIDA KRUSEI: NOT DETECTED
CANDIDA PARAPSILOSIS: NOT DETECTED
Candida albicans: NOT DETECTED
Candida tropicalis: NOT DETECTED
ENTEROBACTER CLOACAE COMPLEX: NOT DETECTED
ESCHERICHIA COLI: NOT DETECTED
Enterobacteriaceae species: NOT DETECTED
Enterococcus species: NOT DETECTED
Haemophilus influenzae: NOT DETECTED
KLEBSIELLA OXYTOCA: NOT DETECTED
KLEBSIELLA PNEUMONIAE: NOT DETECTED
Listeria monocytogenes: NOT DETECTED
Neisseria meningitidis: NOT DETECTED
PROTEUS SPECIES: NOT DETECTED
PSEUDOMONAS AERUGINOSA: NOT DETECTED
SERRATIA MARCESCENS: NOT DETECTED
STAPHYLOCOCCUS AUREUS BCID: NOT DETECTED
STAPHYLOCOCCUS SPECIES: NOT DETECTED
STREPTOCOCCUS PNEUMONIAE: NOT DETECTED
Streptococcus agalactiae: NOT DETECTED
Streptococcus pyogenes: DETECTED — AB
Streptococcus species: DETECTED — AB

## 2016-06-19 LAB — BASIC METABOLIC PANEL
ANION GAP: 7 (ref 5–15)
BUN: 5 mg/dL — ABNORMAL LOW (ref 6–20)
CALCIUM: 7.4 mg/dL — AB (ref 8.9–10.3)
CO2: 22 mmol/L (ref 22–32)
Chloride: 105 mmol/L (ref 101–111)
Creatinine, Ser: 0.61 mg/dL (ref 0.44–1.00)
GFR calc non Af Amer: >60 mL/min (ref >60)
Glucose, Bld: 97 mg/dL (ref 65–99)
Potassium: 3.3 mmol/L — ABNORMAL LOW (ref 3.5–5.1)
Sodium: 134 mmol/L — ABNORMAL LOW (ref 135–145)

## 2016-06-19 LAB — GLUCOSE, CAPILLARY
GLUCOSE-CAPILLARY: 112 mg/dL — AB (ref 65–99)
Glucose-Capillary: 108 mg/dL — ABNORMAL HIGH (ref 65–99)

## 2016-06-19 LAB — HIV ANTIBODY (ROUTINE TESTING W REFLEX): HIV Screen 4th Generation wRfx: NONREACTIVE

## 2016-06-19 SURGERY — IRRIGATION AND DEBRIDEMENT EXTREMITY
Anesthesia: General | Site: Arm Lower | Laterality: Right

## 2016-06-19 MED ORDER — FENTANYL CITRATE (PF) 100 MCG/2ML IJ SOLN
INTRAMUSCULAR | Status: DC | PRN
  Administered 2016-06-19: 25 ug via INTRAVENOUS
  Administered 2016-06-19 (×2): 50 ug via INTRAVENOUS
  Administered 2016-06-19: 25 ug via INTRAVENOUS
  Administered 2016-06-19: 50 ug via INTRAVENOUS
  Administered 2016-06-19: 25 ug via INTRAVENOUS
  Administered 2016-06-19: 50 ug via INTRAVENOUS
  Administered 2016-06-19: 25 ug via INTRAVENOUS

## 2016-06-19 MED ORDER — SODIUM CHLORIDE 0.9% FLUSH
10.0000 mL | INTRAVENOUS | Status: DC | PRN
  Administered 2016-06-21: 10 mL
  Administered 2016-06-21: 20 mL
  Administered 2016-06-22: 10 mL
  Administered 2016-06-22: 20 mL
  Filled 2016-06-19 (×4): qty 40

## 2016-06-19 MED ORDER — FENTANYL CITRATE (PF) 100 MCG/2ML IJ SOLN
50.0000 ug | Freq: Once | INTRAMUSCULAR | Status: AC
  Administered 2016-06-19: 50 ug via INTRAVENOUS

## 2016-06-19 MED ORDER — DEXMEDETOMIDINE HCL IN NACL 200 MCG/50ML IV SOLN
INTRAVENOUS | Status: AC
  Filled 2016-06-19: qty 50

## 2016-06-19 MED ORDER — SUCCINYLCHOLINE CHLORIDE 200 MG/10ML IV SOSY
PREFILLED_SYRINGE | INTRAVENOUS | Status: AC
  Filled 2016-06-19: qty 10

## 2016-06-19 MED ORDER — SODIUM CHLORIDE 0.9 % IV SOLN
INTRAVENOUS | Status: AC
  Administered 2016-06-19: 11:00:00 via INTRAVENOUS

## 2016-06-19 MED ORDER — PROPOFOL 10 MG/ML IV BOLUS
INTRAVENOUS | Status: AC
  Filled 2016-06-19: qty 20

## 2016-06-19 MED ORDER — FENTANYL CITRATE (PF) 100 MCG/2ML IJ SOLN
INTRAMUSCULAR | Status: AC
  Filled 2016-06-19: qty 2

## 2016-06-19 MED ORDER — OXYCODONE HCL 5 MG PO TABS: 5.0000 mg | ORAL_TABLET | Freq: Once | ORAL | Status: DC | PRN

## 2016-06-19 MED ORDER — MIDAZOLAM HCL 5 MG/5ML IJ SOLN
INTRAMUSCULAR | Status: DC | PRN
  Administered 2016-06-19: 2 mg via INTRAVENOUS

## 2016-06-19 MED ORDER — DEXMEDETOMIDINE HCL 200 MCG/2ML IV SOLN
INTRAVENOUS | Status: DC | PRN
  Administered 2016-06-19: 16 ug via INTRAVENOUS
  Administered 2016-06-19: 8 ug via INTRAVENOUS
  Administered 2016-06-19: 16 ug via INTRAVENOUS
  Administered 2016-06-19 (×2): 8 ug via INTRAVENOUS

## 2016-06-19 MED ORDER — MIDAZOLAM HCL 2 MG/2ML IJ SOLN
INTRAMUSCULAR | Status: AC
  Administered 2016-06-19: 2 mg via INTRAVENOUS
  Filled 2016-06-19: qty 2

## 2016-06-19 MED ORDER — MIDAZOLAM HCL 2 MG/2ML IJ SOLN
INTRAMUSCULAR | Status: AC
  Filled 2016-06-19: qty 2

## 2016-06-19 MED ORDER — ONDANSETRON HCL 4 MG/2ML IJ SOLN
INTRAMUSCULAR | Status: AC
  Filled 2016-06-19: qty 2

## 2016-06-19 MED ORDER — FENTANYL CITRATE (PF) 100 MCG/2ML IJ SOLN
50.0000 ug | INTRAMUSCULAR | Status: DC | PRN
  Administered 2016-06-19: 50 ug via INTRAVENOUS
  Filled 2016-06-19: qty 2

## 2016-06-19 MED ORDER — FENTANYL CITRATE (PF) 100 MCG/2ML IJ SOLN
INTRAMUSCULAR | Status: AC
  Filled 2016-06-19: qty 4

## 2016-06-19 MED ORDER — SODIUM CHLORIDE 0.9 % IV SOLN
30.0000 meq | Freq: Once | INTRAVENOUS | Status: AC
  Administered 2016-06-19: 30 meq via INTRAVENOUS
  Filled 2016-06-19: qty 15

## 2016-06-19 MED ORDER — VANCOMYCIN HCL 500 MG IV SOLR
500.0000 mg | Freq: Three times a day (TID) | INTRAVENOUS | Status: DC
  Administered 2016-06-19 – 2016-06-20 (×4): 500 mg via INTRAVENOUS
  Filled 2016-06-19 (×6): qty 500

## 2016-06-19 MED ORDER — SODIUM CHLORIDE 0.9 % IR SOLN
Status: DC | PRN
  Administered 2016-06-19: 1000 mL

## 2016-06-19 MED ORDER — EPHEDRINE 5 MG/ML INJ
INTRAVENOUS | Status: AC
  Filled 2016-06-19: qty 10

## 2016-06-19 MED ORDER — NOREPINEPHRINE BITARTRATE 1 MG/ML IV SOLN
0.0000 ug/min | INTRAVENOUS | Status: DC
  Administered 2016-06-19: 10 ug/min via INTRAVENOUS
  Administered 2016-06-19: 14 ug via INTRAVENOUS
  Administered 2016-06-19: 14 ug/min via INTRAVENOUS
  Administered 2016-06-19: 9 ug/min via INTRAVENOUS
  Administered 2016-06-20: 2 ug/min via INTRAVENOUS
  Filled 2016-06-19 (×2): qty 4

## 2016-06-19 MED ORDER — MIDAZOLAM HCL 2 MG/2ML IJ SOLN
2.0000 mg | Freq: Once | INTRAMUSCULAR | Status: AC
  Administered 2016-06-19: 2 mg via INTRAVENOUS

## 2016-06-19 MED ORDER — DEXAMETHASONE SODIUM PHOSPHATE 10 MG/ML IJ SOLN
INTRAMUSCULAR | Status: AC
  Filled 2016-06-19: qty 2

## 2016-06-19 MED ORDER — ONDANSETRON HCL 4 MG/2ML IJ SOLN
INTRAMUSCULAR | Status: DC | PRN
  Administered 2016-06-19: 4 mg via INTRAVENOUS

## 2016-06-19 MED ORDER — CLINDAMYCIN PHOSPHATE 600 MG/50ML IV SOLN
600.0000 mg | Freq: Three times a day (TID) | INTRAVENOUS | Status: DC
  Administered 2016-06-19 – 2016-06-20 (×4): 600 mg via INTRAVENOUS
  Filled 2016-06-19 (×6): qty 50

## 2016-06-19 MED ORDER — FENTANYL CITRATE (PF) 100 MCG/2ML IJ SOLN
50.0000 ug | Freq: Once | INTRAMUSCULAR | Status: DC
Start: 2016-06-19 — End: 2016-06-23
  Filled 2016-06-19: qty 2

## 2016-06-19 MED ORDER — MIDAZOLAM HCL 2 MG/2ML IJ SOLN
2.0000 mg | Freq: Once | INTRAMUSCULAR | Status: AC
  Administered 2016-06-19: 2 mg via INTRAVENOUS
  Filled 2016-06-19: qty 2

## 2016-06-19 MED ORDER — MIDAZOLAM HCL 2 MG/2ML IJ SOLN
INTRAMUSCULAR | Status: AC
  Administered 2016-06-19: 2 mg
  Filled 2016-06-19: qty 2

## 2016-06-19 MED ORDER — PROPOFOL 10 MG/ML IV BOLUS
INTRAVENOUS | Status: DC | PRN
  Administered 2016-06-19: 20 mg via INTRAVENOUS

## 2016-06-19 MED ORDER — FENTANYL CITRATE (PF) 100 MCG/2ML IJ SOLN
50.0000 ug | INTRAMUSCULAR | Status: DC | PRN
  Administered 2016-06-19 – 2016-06-20 (×5): 50 ug via INTRAVENOUS
  Filled 2016-06-19 (×5): qty 2

## 2016-06-19 MED ORDER — FENTANYL CITRATE (PF) 100 MCG/2ML IJ SOLN: 25.0000 ug | INTRAMUSCULAR | Status: DC | PRN

## 2016-06-19 MED ORDER — DEXAMETHASONE SODIUM PHOSPHATE 10 MG/ML IJ SOLN
INTRAMUSCULAR | Status: DC | PRN
  Administered 2016-06-19: 10 mg via INTRAVENOUS

## 2016-06-19 MED ORDER — OXYCODONE HCL 5 MG/5ML PO SOLN: 5.0000 mg | Freq: Once | ORAL | Status: DC | PRN

## 2016-06-19 MED ORDER — ONDANSETRON HCL 4 MG/2ML IJ SOLN: 4.0000 mg | Freq: Three times a day (TID) | INTRAMUSCULAR | Status: DC | PRN

## 2016-06-19 MED ORDER — SODIUM CHLORIDE 0.9% FLUSH
10.0000 mL | Freq: Two times a day (BID) | INTRAVENOUS | Status: DC
  Administered 2016-06-19 – 2016-06-21 (×2): 10 mL

## 2016-06-19 MED ORDER — ONDANSETRON HCL 4 MG/2ML IJ SOLN: 4.0000 mg | Freq: Once | INTRAMUSCULAR | Status: DC | PRN

## 2016-06-19 MED ORDER — LORAZEPAM 2 MG/ML IJ SOLN
2.0000 mg | Freq: Four times a day (QID) | INTRAMUSCULAR | Status: DC | PRN
  Administered 2016-06-19 – 2016-06-23 (×11): 2 mg via INTRAVENOUS
  Filled 2016-06-19 (×12): qty 1

## 2016-06-19 MED ORDER — FENTANYL CITRATE (PF) 100 MCG/2ML IJ SOLN: 50.0000 ug | INTRAMUSCULAR | Status: DC | PRN

## 2016-06-19 MED ORDER — FENTANYL CITRATE (PF) 100 MCG/2ML IJ SOLN
INTRAMUSCULAR | Status: AC
  Administered 2016-06-19: 50 ug
  Filled 2016-06-19: qty 2

## 2016-06-19 MED ORDER — MIDAZOLAM HCL 2 MG/2ML IJ SOLN: 2.0000 mg | Freq: Once | INTRAMUSCULAR | Status: DC

## 2016-06-19 MED ORDER — LACTATED RINGERS IV SOLN
INTRAVENOUS | Status: DC | PRN
  Administered 2016-06-19: 08:00:00 via INTRAVENOUS

## 2016-06-19 SURGICAL SUPPLY — 52 items
BANDAGE ELASTIC 3 VELCRO ST LF (GAUZE/BANDAGES/DRESSINGS) ×3 IMPLANT
BANDAGE ELASTIC 4 VELCRO ST LF (GAUZE/BANDAGES/DRESSINGS) ×3 IMPLANT
BANDAGE ELASTIC 6 VELCRO ST LF (GAUZE/BANDAGES/DRESSINGS) ×3 IMPLANT
BLADE SURG 10 STRL SS (BLADE) ×3 IMPLANT
BNDG COHESIVE 4X5 TAN STRL (GAUZE/BANDAGES/DRESSINGS) ×3 IMPLANT
BNDG COHESIVE 6X5 TAN STRL LF (GAUZE/BANDAGES/DRESSINGS) ×6 IMPLANT
BNDG GAUZE ELAST 4 BULKY (GAUZE/BANDAGES/DRESSINGS) ×3 IMPLANT
COVER SURGICAL LIGHT HANDLE (MISCELLANEOUS) ×3 IMPLANT
CUFF TOURNIQUET SINGLE 18IN (TOURNIQUET CUFF) IMPLANT
CUFF TOURNIQUET SINGLE 34IN LL (TOURNIQUET CUFF) ×3 IMPLANT
DRAPE ORTHO SPLIT 77X108 STRL (DRAPES) ×4
DRAPE SURG 17X23 STRL (DRAPES) IMPLANT
DRAPE SURG ORHT 6 SPLT 77X108 (DRAPES) ×2 IMPLANT
DRAPE U-SHAPE 47X51 STRL (DRAPES) ×3 IMPLANT
DURAPREP 26ML APPLICATOR (WOUND CARE) ×3 IMPLANT
ELECT REM PT RETURN 9FT ADLT (ELECTROSURGICAL) ×3
ELECTRODE REM PT RTRN 9FT ADLT (ELECTROSURGICAL) ×1 IMPLANT
GAUZE SPONGE 4X4 12PLY STRL (GAUZE/BANDAGES/DRESSINGS) ×3 IMPLANT
GAUZE XEROFORM 1X8 LF (GAUZE/BANDAGES/DRESSINGS) ×3 IMPLANT
GLOVE BIO SURGEON STRL SZ8 (GLOVE) ×3 IMPLANT
GLOVE BIOGEL PI IND STRL 8 (GLOVE) ×2 IMPLANT
GLOVE BIOGEL PI INDICATOR 8 (GLOVE) ×4
GLOVE ORTHO TXT STRL SZ7.5 (GLOVE) ×3 IMPLANT
GOWN STRL REUS W/ TWL LRG LVL3 (GOWN DISPOSABLE) ×1 IMPLANT
GOWN STRL REUS W/ TWL XL LVL3 (GOWN DISPOSABLE) ×2 IMPLANT
GOWN STRL REUS W/TWL LRG LVL3 (GOWN DISPOSABLE) ×2
GOWN STRL REUS W/TWL XL LVL3 (GOWN DISPOSABLE) ×4
HANDPIECE INTERPULSE COAX TIP (DISPOSABLE)
KIT BASIN OR (CUSTOM PROCEDURE TRAY) ×3 IMPLANT
KIT ROOM TURNOVER OR (KITS) ×3 IMPLANT
MANIFOLD NEPTUNE II (INSTRUMENTS) ×3 IMPLANT
NS IRRIG 1000ML POUR BTL (IV SOLUTION) ×3 IMPLANT
PACK ORTHO EXTREMITY (CUSTOM PROCEDURE TRAY) ×3 IMPLANT
PAD ARMBOARD 7.5X6 YLW CONV (MISCELLANEOUS) ×6 IMPLANT
PADDING CAST ABS 4INX4YD NS (CAST SUPPLIES) ×4
PADDING CAST ABS COTTON 4X4 ST (CAST SUPPLIES) ×2 IMPLANT
PADDING CAST COTTON 6X4 STRL (CAST SUPPLIES) ×3 IMPLANT
SET HNDPC FAN SPRY TIP SCT (DISPOSABLE) IMPLANT
SPONGE GAUZE 4X4 12PLY STER LF (GAUZE/BANDAGES/DRESSINGS) ×3 IMPLANT
SPONGE LAP 18X18 X RAY DECT (DISPOSABLE) ×3 IMPLANT
STOCKINETTE IMPERVIOUS 9X36 MD (GAUZE/BANDAGES/DRESSINGS) ×3 IMPLANT
SUT ETHILON 2 0 FS 18 (SUTURE) IMPLANT
SUT ETHILON 3 0 PS 1 (SUTURE) ×6 IMPLANT
SUT VIC AB 2-0 SH 27 (SUTURE) ×2
SUT VIC AB 2-0 SH 27XBRD (SUTURE) ×1 IMPLANT
TOWEL OR 17X24 6PK STRL BLUE (TOWEL DISPOSABLE) ×3 IMPLANT
TOWEL OR 17X26 10 PK STRL BLUE (TOWEL DISPOSABLE) ×3 IMPLANT
TUBE ANAEROBIC SPECIMEN COL (MISCELLANEOUS) ×3 IMPLANT
TUBE CONNECTING 12'X1/4 (SUCTIONS) ×1
TUBE CONNECTING 12X1/4 (SUCTIONS) ×2 IMPLANT
UNDERPAD 30X30 (UNDERPADS AND DIAPERS) ×3 IMPLANT
YANKAUER SUCT BULB TIP NO VENT (SUCTIONS) ×3 IMPLANT

## 2016-06-19 NOTE — Consult Note (Signed)
PULMONARY / CRITICAL CARE MEDICINE   Name: [name] MRN: [id number] DOB: [birth year]    ADMISSION DATE:  06/18/2016 CONSULTATION DATE:  06/18/16  REFERRING MD:  ED physicican  CHIEF COMPLAINT:  Septic shock  HISTORY OF PRESENT ILLNESS:   [name] is a 20 y/o woman with a history of IV drug use, injects heroin and smokes cocaine.  Patient presents to the ED with complaint of right elbow pain, rash, cough and congestion, fever. Pt states approximately a week ago she injected right AC with crack cocaine "wash." She explains that is when a cotton is soaked and crack cocaine and vinegar, and then this mixture is drawn and injected. Patient states in her case this was old and she just had leftovers from a couple of days that she drew up and injected. She states since then she developed pain and swelling to the right elbow. She has pain with full extension of the joint. She is also reporting cough and congestion with green productive sputum for 1-2 weeks. She states today she broke out in a rash in her extremities. She believes she developed fever at some point today as well. She has been staying in a hotel with an elderly couple that picked her up that has dogs. States rash is itchy. She was given Benadryl by EMS prior to coming in. She reports no prior medical problems. She denies taking any medications prior to coming in.  Pt remained hypotensive despite 5L IV fluids and was eventually started on levophed and admitted to the ICU.   SUBJECTIVE:  No events overnight  VITAL SIGNS: BP 91/71   Pulse (!) 108   Temp 98.8 F (37.1 C) (Oral)   Resp (!) 30   Ht  (1.6 m)   Wt 56 kg (123 lb 7.3 oz)   LMP 05/29/2016   SpO2 100%   BMI 21.87 kg/m   HEMODYNAMICS:    VENTILATOR SETTINGS:    INTAKE / OUTPUT: I/O last 3 completed shifts: In: 5234.5 [I.V.:254.5; IV Piggyback:4980] Out: 1500 [Urine:975; Stool:525]  PHYSICAL EXAMINATION: General:  Laying in bed, no acute distress  but quite anxious Neuro:  A&Ox3, moving all ext to command HEENT:  PERRL, EOMI, OP clear Cardiovascular:  Tachycardic, regular rhythm, no MRG Lungs:  Scattered rhonchi Abdomen:  Soft, NTND, +BS Musculoskeletal:  No edema, right forearm swollen Skin:  Multiple bruises and injection sites.  LABS:  BMET  Recent Labs Lab 06/18/16 2000 06/19/16 0304  NA 125* 134*  K 2.3* 3.3*  CL 88* 105  CO2 26 22  BUN 5* 5*  CREATININE 0.83 0.61  GLUCOSE 112* 97   Electrolytes  Recent Labs Lab 06/18/16 2000 06/19/16 0304  CALCIUM 7.7* 7.4*   CBC  Recent Labs Lab 06/18/16 2000 06/19/16 0304  WBC 9.0 8.0  HGB 11.1* 10.7*  HCT 32.3* 32.0*  PLT 141* 107*   Coag's No results for input(s): APTT, INR in the last 168 hours.  Sepsis Markers  Recent Labs Lab 06/18/16 2006 06/18/16 2251  LATICACIDVEN 2.22* 1.45   ABG No results for input(s): PHART, PCO2ART, PO2ART in the last 168 hours.  Liver Enzymes  Recent Labs Lab 06/18/16 2000  AST 44*  ALT 28  ALKPHOS 47  BILITOT 0.6  ALBUMIN 2.3*   Cardiac Enzymes No results for input(s): TROPONINI, PROBNP in the last 168 hours.  Glucose  Recent Labs Lab 06/19/16 0139 06/19/16 0747  GLUCAP 112* 108*   Imaging Dg Chest 2 View  Result Date:  06/18/2016 CLINICAL DATA:  Pt has had congestion with a productive cough w/yellowish phlegm. She was trying to inject crack intraveniously and missed her vein. She said her whole elbow feels tight and she's unable to straighten her arm out. EXAM: CHEST  2 VIEW COMPARISON:  None. FINDINGS: The heart size and mediastinal contours are within normal limits. Both lungs are clear. No pleural effusion or pneumothorax. The visualized skeletal structures are unremarkable. IMPRESSION: No active cardiopulmonary disease. Electronically Signed   By: Amie Portlandavid  Ormond M.D.   On: 06/18/2016 21:17   Dg Elbow Complete Right  Result Date: 06/18/2016 CLINICAL DATA:  Pt has had congestion with a productive  cough w/yellowish phlegm. She was trying to inject crack intraveniously and missed her vein. She said her whole elbow feels tight and she's unable to straighten her arm out. EXAM: RIGHT ELBOW - COMPLETE 3+ VIEW COMPARISON:  None. FINDINGS: No fracture.  No bone lesion. The elbow joint is normally spaced and aligned. No arthropathic change. No joint effusion. There is subcutaneous soft tissue edema/cellulitis most evident along the dorsal aspect of the elbow and proximal forearm. No soft tissue air. IMPRESSION: 1. No fracture, bone lesion or elbow joint abnormality. 2. Soft tissue edema/cellulitis. Electronically Signed   By: Amie Portlandavid  Ormond M.D.   On: 06/18/2016 21:18   Ct Eblow Right W Contrast  Result Date: 06/18/2016 CLINICAL DATA:  Acute onset of right elbow pain and swelling. Initial encounter. EXAM: CT OF THE UPPER RIGHT EXTREMITY WITH CONTRAST TECHNIQUE: Multidetector CT imaging of the upper right extremity was performed according to the standard protocol following intravenous contrast administration. COMPARISON:  None. CONTRAST:  100mL ISOVUE-300 IOPAMIDOL (ISOVUE-300) INJECTION 61% FINDINGS: Bones/Joint/Cartilage There is no evidence of fracture or dislocation. No osseous erosion is seen. Note is made of a right elbow joint effusion, with mild peripheral enhancement, raising suspicion for septic joint. Ligaments Suboptimally assessed by CT. Muscles and Tendons An apparent tiny 1.2 x 0.8 cm intramuscular abscess is seen within the superficial musculature at the level of the antecubital fossa. The visualized musculature is otherwise grossly unremarkable. The visualized tendon structures are grossly unremarkable. Soft tissues Mild soft tissue edema is seen tracking along the dorsum of the forearm and elbow. There appears to be diffuse thrombophlebitis involving multiple veins at and above the antecubital fossa. IMPRESSION: 1. No evidence of fracture or dislocation. No evidence of osseous erosion. 2. Right  elbow joint effusion, with mild peripheral enhancement, raising suspicion for septic joint. 3. Apparent tiny 1.2 cm intramuscular abscess within the superficial musculature at the level of the antecubital fossa. 4. Diffuse thrombophlebitis involving multiple veins at and above the antecubital fossa. 5. Mild soft tissue edema tracking along the dorsum of the forearm and elbow. Electronically Signed   By: Roanna RaiderJeffery  Chang M.D.   On: 06/18/2016 22:40   Dg Chest Port 1 View  Result Date: 06/19/2016 CLINICAL DATA:  Central line placement.  Initial encounter. EXAM: PORTABLE CHEST 1 VIEW COMPARISON:  Chest radiograph performed 06/18/2016 FINDINGS: The lungs are well-aerated. Vascular congestion is noted. There is no evidence of focal opacification, pleural effusion or pneumothorax. The cardiomediastinal silhouette is within normal limits. No acute osseous abnormalities are seen. A left subclavian line is noted ending about the mid SVC. IMPRESSION: Vascular congestion noted.  Lungs remain grossly clear. Electronically Signed   By: Roanna RaiderJeffery  Chang M.D.   On: 06/19/2016 04:21   STUDIES:  CT right elbow - soft tissue swelling, small abscess, elbow joint effusion   CULTURES:  Blood  ANTIBIOTICS: Aztreonam 06/18/16 ---> Vanc 06/18/16 --> Levaquin 06/18/16 -->  SIGNIFICANT EVENTS: 12/3>>>to the OR for I&D  LINES/TUBES: Left subclavian TLC 06/19/16  DISCUSSION: 20 y/o IVDU with fluid refractory shock 2/2 soft tissue infection of right arm.  ASSESSMENT / PLAN:  PULMONARY A: Productive cough P:   No evidence of pna on CXR O2 by nasal canula to maintain sats >92%  CARDIOVASCULAR A:  Tachycardia, hypotension P:  2/2 sepsis and opioid withdrawal Levophed to maintain MAP >65  GASTROINTESTINAL A:   H/o vomiting  P:   Likely 2/2 withdrawal Treat symptomatically with antiemetics  HEMATOLOGIC A:   Anemia, thrombocytopenia P:  Likely 2/2 to infection Follow  INFECTIOUS A:   Soft tissue  infection of right arm P:   Seen and evaluated by ortho - going to the OR today for I&D per ortho Vanc, Aztrenam and levaquin (penicillin allergy) pending culture results  FAMILY  - Updates: Patient updated bedside.  - Inter-disciplinary family meet or Palliative Care meeting due by:  06/25/16  The patient is critically ill with multiple organ systems failure and requires high complexity decision making for assessment and support, frequent evaluation and titration of therapies, application of advanced monitoring technologies and extensive interpretation of multiple databases.   Critical Care Time devoted to patient care services described in this note is  35  Minutes. This time reflects time of care of this signee Dr Koren Bound. This critical care time does not reflect procedure time, or teaching time or supervisory time of PA/NP/Med student/Med Resident etc but could involve care discussion time.  Alyson Reedy, M.D. [facility] Pulmonary/Critical Care Medicine. Pager: [phone]. After hours pager: [phone].  06/19/2016, 8:54 AM

## 2016-06-19 NOTE — Progress Notes (Signed)
[facility] FOR AM LAB REPLACEMENT ONLY  The patient does [facility] Protocol based on the criteria listed below:   1. Is GFR >/= 40 ml/min? Yes.    Patient's GFR today is >60 2. Is urine output >/= 0.5 ml/kg/hr for the last 6 hours? Yes.   Patient's UOP is 1.0 ml/kg/hr 3. Is BUN < 60 mg/dL? Yes.    Patient's BUN today is 5 4. Abnormal electrolyte(s):K 3.3 5. Ordered repletion with: protocol 6. If a panic level lab has been reported, has the CCM MD in charge been notified? No..   Physician:    Markus DaftWHELAN, Fahim Kats A 06/19/2016 5:40 AM

## 2016-06-19 NOTE — Progress Notes (Signed)
Orthopedic Tech Progress Note Patient Details:  Awanda MinkLindsey XXXPatterson [birth year] [id number]  Ortho Devices Type of Ortho Device: Other (comment) Ortho Device/Splint Location: mission sling Ortho Device/Splint Interventions: Ordered, Application Applied as per drs verbal order  Trinna PostMartinez, Tymika Grilli J 06/19/2016, 2:50 AM

## 2016-06-19 NOTE — Consult Note (Signed)
Reason for Consult:  Right septic elbow; antecubital fossa abscess Referring Physician:   Critical Care Specialists  Megan Farmer is an 20 y.o. female.  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 I was consulted by the critical care specialists to evaluate her right arm due to questionable septic elbow joint. A CT scan has been obtained of the right elbow which shows fluid enhancement in the antecubital fossa area and very small abscess. There is a mild effusion of the elbow joint itself that is likely reactive edema. The patient does report pain in her "arm crease". She is septic appearing.   History reviewed. No pertinent past medical history.  History reviewed. No pertinent surgical history.  History reviewed. No pertinent family history.  Social History:  reports that she has been smoking.  She has never used smokeless tobacco. She reports that she uses drugs, including IV and Cocaine. She reports that she does not drink alcohol.  Allergies:  Allergies  Allergen Reactions  . Amoxicillin Swelling  . Penicillins Swelling    Medications: I have reviewed the patient's current medications.  Results for orders placed or performed during the hospital encounter of 06/18/16 (from the past 48 hour(s))  Comprehensive metabolic panel     Status: Abnormal   Collection Time: 06/18/16  8:00 PM  Result Value Ref Range   Sodium 125 (L) 135 - 145 mmol/L   Potassium 2.3 (LL) 3.5 - 5.1 mmol/L    Comment: CRITICAL RESULT CALLED TO, READ BACK BY AND VERIFIED WITH: RN C PRICE AT [id number] [id number] MARTINB    Chloride 88 (L) 101 - 111 mmol/L   CO2 26 22 - 32 mmol/L   Glucose, Bld 112 (H) 65 - 99 mg/dL   BUN 5 (L) 6 - 20 mg/dL   Creatinine, Ser 0.83 0.44 - 1.00 mg/dL   Calcium 7.7 (L) 8.9 - 10.3 mg/dL   Total Protein 5.7 (L) 6.5 - 8.1 g/dL   Albumin 2.3 (L) 3.5 - 5.0 g/dL   AST 44 (H) 15 - 41 U/L   ALT 28 14 - 54 U/L    Alkaline Phosphatase 47 38 - 126 U/L   Total Bilirubin 0.6 0.3 - 1.2 mg/dL   GFR calc non Af Amer >60 >60 mL/min   GFR calc Af Amer >60 >60 mL/min    Comment: (NOTE) The eGFR has been calculated using the CKD EPI equation. This calculation has not been validated in all clinical situations. eGFR's persistently <60 mL/min signify possible Chronic Kidney Disease.    Anion gap 11 5 - 15  CBC with Differential     Status: Abnormal   Collection Time: 06/18/16  8:00 PM  Result Value Ref Range   WBC 9.0 4.0 - 10.5 K/uL   RBC 4.15 3.87 - 5.11 MIL/uL   Hemoglobin 11.1 (L) 12.0 - 15.0 g/dL   HCT 32.3 (L) 36.0 - 46.0 %   MCV 77.8 (L) 78.0 - 100.0 fL   MCH 26.7 26.0 - 34.0 pg   MCHC 34.4 30.0 - 36.0 g/dL   RDW 13.4 11.5 - 15.5 %   Platelets 141 (L) 150 - 400 K/uL   Neutrophils Relative % 86 %   Neutro Abs 7.8 (H) 1.7 - 7.7 K/uL   Lymphocytes Relative 10 %   Lymphs Abs 0.9 0.7 - 4.0 K/uL   Monocytes Relative 4 %   Monocytes Absolute 0.3 0.1 - 1.0 K/uL  Eosinophils Relative 0 %   Eosinophils Absolute 0.0 0.0 - 0.7 K/uL   Basophils Relative 0 %   Basophils Absolute 0.0 0.0 - 0.1 K/uL  Urinalysis, Routine w reflex microscopic     Status: Abnormal   Collection Time: 06/18/16  8:01 PM  Result Value Ref Range   Color, Urine YELLOW YELLOW   APPearance CLOUDY (A) CLEAR   Specific Gravity, Urine 1.019 1.005 - 1.030   pH 6.0 5.0 - 8.0   Glucose, UA NEGATIVE NEGATIVE mg/dL   Hgb urine dipstick NEGATIVE NEGATIVE   Bilirubin Urine NEGATIVE NEGATIVE   Ketones, ur NEGATIVE NEGATIVE mg/dL   Protein, ur 100 (A) NEGATIVE mg/dL   Nitrite NEGATIVE NEGATIVE   Leukocytes, UA SMALL (A) NEGATIVE  Urine microscopic-add on     Status: Abnormal   Collection Time: 06/18/16  8:01 PM  Result Value Ref Range   Squamous Epithelial / LPF 6-30 (A) NONE SEEN   WBC, UA 6-30 0 - 5 WBC/hpf   RBC / HPF 0-5 0 - 5 RBC/hpf   Bacteria, UA MANY (A) NONE SEEN   Casts HYALINE CASTS (A) NEGATIVE   Urine-Other MUCOUS  PRESENT   I-Stat CG4 Lactic Acid, ED     Status: Abnormal   Collection Time: 06/18/16  8:06 PM  Result Value Ref Range   Lactic Acid, Venous 2.22 (HH) 0.5 - 1.9 mmol/L   Comment NOTIFIED PHYSICIAN   I-Stat Troponin, ED (not at [facility])     Status: None   Collection Time: 06/18/16  8:41 PM  Result Value Ref Range   Troponin i, poc 0.04 0.00 - 0.08 ng/mL   Comment 3            Comment: Due to the release kinetics of cTnI, a negative result within the first hours of the onset of symptoms does not rule out myocardial infarction with certainty. If myocardial infarction is still suspected, repeat the test at appropriate intervals.   I-Stat Beta hCG blood, ED ([facility], [facility], [facility] only)     Status: None   Collection Time: 06/18/16  8:41 PM  Result Value Ref Range   I-stat hCG, quantitative <5.0 <5 mIU/mL   Comment 3            Comment:   GEST. AGE      CONC.  (mIU/mL)   <=1 WEEK        5 - 50     2 WEEKS       50 - 500     3 WEEKS       100 - 10,000     4 WEEKS     1,000 - 30,000        FEMALE AND NON-PREGNANT FEMALE:     LESS THAN 5 mIU/mL   Rapid urine drug screen (hospital performed)     Status: Abnormal   Collection Time: 06/18/16  8:47 PM  Result Value Ref Range   Opiates POSITIVE (A) NONE DETECTED   Cocaine POSITIVE (A) NONE DETECTED   Benzodiazepines NONE DETECTED NONE DETECTED   Amphetamines NONE DETECTED NONE DETECTED   Tetrahydrocannabinol NONE DETECTED NONE DETECTED   Barbiturates NONE DETECTED NONE DETECTED    Comment:        DRUG SCREEN FOR MEDICAL PURPOSES ONLY.  IF CONFIRMATION IS NEEDED FOR ANY PURPOSE, NOTIFY LAB WITHIN 5 DAYS.        LOWEST DETECTABLE LIMITS FOR URINE DRUG SCREEN Drug Class       Cutoff (ng/mL)  Amphetamine      1000 Barbiturate      200 Benzodiazepine   177 Tricyclics       939 Opiates          300 Cocaine          300 THC              50   I-Stat CG4 Lactic Acid, ED     Status: None   Collection Time: 06/18/16 10:51 PM  Result Value Ref Range    Lactic Acid, Venous 1.45 0.5 - 1.9 mmol/L  Glucose, capillary     Status: Abnormal   Collection Time: 06/19/16  1:39 AM  Result Value Ref Range   Glucose-Capillary 112 (H) 65 - 99 mg/dL   Comment 1 Notify RN     Dg Chest 2 View  Result Date: 06/18/2016 CLINICAL DATA:  Pt has had congestion with a productive cough w/yellowish phlegm. She was trying to inject crack intraveniously and missed her vein. She said her whole elbow feels tight and she's unable to straighten her arm out. EXAM: CHEST  2 VIEW COMPARISON:  None. FINDINGS: The heart size and mediastinal contours are within normal limits. Both lungs are clear. No pleural effusion or pneumothorax. The visualized skeletal structures are unremarkable. IMPRESSION: No active cardiopulmonary disease. Electronically Signed   By: Lajean Manes M.D.   On: 06/18/2016 21:17   Dg Elbow Complete Right  Result Date: 06/18/2016 CLINICAL DATA:  Pt has had congestion with a productive cough w/yellowish phlegm. She was trying to inject crack intraveniously and missed her vein. She said her whole elbow feels tight and she's unable to straighten her arm out. EXAM: RIGHT ELBOW - COMPLETE 3+ VIEW COMPARISON:  None. FINDINGS: No fracture.  No bone lesion. The elbow joint is normally spaced and aligned. No arthropathic change. No joint effusion. There is subcutaneous soft tissue edema/cellulitis most evident along the dorsal aspect of the elbow and proximal forearm. No soft tissue air. IMPRESSION: 1. No fracture, bone lesion or elbow joint abnormality. 2. Soft tissue edema/cellulitis. Electronically Signed   By: Lajean Manes M.D.   On: 06/18/2016 21:18   Ct Eblow Right W Contrast  Result Date: 06/18/2016 CLINICAL DATA:  Acute onset of right elbow pain and swelling. Initial encounter. EXAM: CT OF THE UPPER RIGHT EXTREMITY WITH CONTRAST TECHNIQUE: Multidetector CT imaging of the upper right extremity was performed according to the standard protocol following  intravenous contrast administration. COMPARISON:  None. CONTRAST:  147m ISOVUE-300 IOPAMIDOL (ISOVUE-300) INJECTION 61% FINDINGS: Bones/Joint/Cartilage There is no evidence of fracture or dislocation. No osseous erosion is seen. Note is made of a right elbow joint effusion, with mild peripheral enhancement, raising suspicion for septic joint. Ligaments Suboptimally assessed by CT. Muscles and Tendons An apparent tiny 1.2 x 0.8 cm intramuscular abscess is seen within the superficial musculature at the level of the antecubital fossa. The visualized musculature is otherwise grossly unremarkable. The visualized tendon structures are grossly unremarkable. Soft tissues Mild soft tissue edema is seen tracking along the dorsum of the forearm and elbow. There appears to be diffuse thrombophlebitis involving multiple veins at and above the antecubital fossa. IMPRESSION: 1. No evidence of fracture or dislocation. No evidence of osseous erosion. 2. Right elbow joint effusion, with mild peripheral enhancement, raising suspicion for septic joint. 3. Apparent tiny 1.2 cm intramuscular abscess within the superficial musculature at the level of the antecubital fossa. 4. Diffuse thrombophlebitis involving multiple veins at and above the antecubital  fossa. 5. Mild soft tissue edema tracking along the dorsum of the forearm and elbow. Electronically Signed   By: Garald Balding M.D.   On: 06/18/2016 22:40    ROS Blood pressure (!) 88/65, pulse (!) 111, temperature 98.9 F (37.2 C), temperature source Oral, resp. rate (!) 31, height '5\' 2"'  (1.575 m), weight 120 lb (54.4 kg), last menstrual period 05/29/2016, SpO2 100 %. Physical Exam She is alert and oriented 3 and follows commands but is septic appearing.  Her right arm shows pain to palpation in the antecubital fossa and some of the proximal volar forearm muscles. Right now the compartments are soft. There is no area of fluctuance. There is no erythema. There is a slight elbow  effusion, but there is no pain to palpation around the elbow joint posteriorly at all. She does move her fingers and thumb and her right hand is well-perfused. There is pain in the forearm when I extend her fingers myself. She reports normal sensation over her hand.    Assessment/Plan: Right arm small antecubital fossa abscess with reactive elbow joint effusion post IV drug use.  I have recommended surgery with irrigation and debridement of her antecubital fossa abscess from where she injected drugs. However right now she is reluctant to provide informed consent for the surgery. She says that her right arm doesn't hurt that bad and she is sick from elsewhere.  I do not feel at all that this is a septic elbow joint. With my unfortunate experience of dealing with abscesses such as this multiple times a year, I've never experienced a septic elbow joint from IV drug use in the antecubital area. The elbow effusion itself is usually reactive edema from the soft-tissue insult. Her pain is definitely from the soft tissue infection and associated edema.  I'm not concerned about the elbow joint itself.  For now I'll placed her right arm and an elevator sling to decrease the edema. She should remain NPO for now. Fortunately she does not appear to have compartment syndrome which is often the case from IV drug abuse in this area of the arm. The abscess itself is very small and I can not palpate any fluctuance.  She wants to see how she does with IV antibiotics. Certainly she is septic appearing and I have explained this to her. I'll reevaluate her again over the next few hours.    Mcarthur Rossetti 06/19/2016, 2:45 AM

## 2016-06-19 NOTE — Progress Notes (Signed)
Pharmacy Antibiotic Note  Megan Farmer is a 20 y.o. female admitted on 06/18/2016 with sepsis.  Pharmacy has been consulted for vancomycin and changing levaquin and aztreonam to clindamycin. Pt with Tmax 103.2 and WBC is WNL. Scr is WNL and decreased from admission. Lactic acid has normalzied  Plan: - Change vancomycin to 500mg  IV Q8 - Clindamycin 600mg  IV Q8H - F/u renal fxn, C&S, clinical status and trough at SS  Height: 5\' 3"  (160 cm) Weight: 123 lb 7.3 oz (56 kg) IBW/kg (Calculated) : 52.4  Temp (24hrs), Avg:99.7 F (37.6 C), Min:97.9 F (36.6 C), Max:103.2 F (39.6 C)   Recent Labs Lab 06/18/16 2000 06/18/16 2006 06/18/16 2251 06/19/16 0304  WBC 9.0  --   --  8.0  CREATININE 0.83  --   --  0.61  LATICACIDVEN  --  2.22* 1.45  --     Estimated Creatinine Clearance: 91.2 mL/min (by C-G formula based on SCr of 0.61 mg/dL).    Allergies  Allergen Reactions  . Amoxicillin Swelling  . Penicillins Swelling    Antimicrobials this admission: Vanc 12/2>> Clinda 12/3>> Levaquin 12/2>>12/3 Aztreonam 12/2>>12/3  Dose adjustments this admission: Changed vanc to 500mg  Q8H on 12/3  Microbiology results: 12/2 BCID - strep pyogenes 12/2 Blood - GPC in chains  12/3 MRSA - NEG  Thank you for allowing pharmacy to be a part of this patient's care.  Megan Farmer, Megan Farmer 06/19/2016 11:05 AM

## 2016-06-19 NOTE — Progress Notes (Signed)
PHARMACY - PHYSICIAN COMMUNICATION CRITICAL VALUE ALERT - BLOOD CULTURE IDENTIFICATION (BCID)  Results for orders placed or performed during the hospital encounter of 06/18/16  Blood Culture ID Panel (Reflexed) (Collected: 06/18/2016  8:12 PM)  Result Value Ref Range   Enterococcus species NOT DETECTED NOT DETECTED   Listeria monocytogenes NOT DETECTED NOT DETECTED   Staphylococcus species NOT DETECTED NOT DETECTED   Staphylococcus aureus NOT DETECTED NOT DETECTED   Streptococcus species DETECTED (A) NOT DETECTED   Streptococcus agalactiae NOT DETECTED NOT DETECTED   Streptococcus pneumoniae NOT DETECTED NOT DETECTED   Streptococcus pyogenes DETECTED (A) NOT DETECTED   Acinetobacter baumannii NOT DETECTED NOT DETECTED   Enterobacteriaceae species NOT DETECTED NOT DETECTED   Enterobacter cloacae complex NOT DETECTED NOT DETECTED   Escherichia coli NOT DETECTED NOT DETECTED   Klebsiella oxytoca NOT DETECTED NOT DETECTED   Klebsiella pneumoniae NOT DETECTED NOT DETECTED   Proteus species NOT DETECTED NOT DETECTED   Serratia marcescens NOT DETECTED NOT DETECTED   Haemophilus influenzae NOT DETECTED NOT DETECTED   Neisseria meningitidis NOT DETECTED NOT DETECTED   Pseudomonas aeruginosa NOT DETECTED NOT DETECTED   Candida albicans NOT DETECTED NOT DETECTED   Candida glabrata NOT DETECTED NOT DETECTED   Candida krusei NOT DETECTED NOT DETECTED   Candida parapsilosis NOT DETECTED NOT DETECTED   Candida tropicalis NOT DETECTED NOT DETECTED    Name of physician (or Provider) Contacted: Yacoub  Changes to prescribed antibiotics required: Continue vancomycin due to penicillin allergy, DC levaquin and aztreonam and change to clindamycin  Tadao Emig, Drake LeachRachel Lynn 06/19/2016  10:56 AM

## 2016-06-19 NOTE — Anesthesia Preprocedure Evaluation (Addendum)
Anesthesia Evaluation  Patient identified by MRN, date of birth, ID band Patient awake    Reviewed: Allergy & Precautions, NPO status , Patient's Chart, lab work & pertinent test results  Airway Mallampati: II  TM Distance: >3 FB     Dental  (+) Teeth Intact, Dental Advisory Given   Pulmonary Current Smoker,    breath sounds clear to auscultation       Cardiovascular  Rhythm:Regular Rate:Normal     Neuro/Psych    GI/Hepatic   Endo/Other    Renal/GU      Musculoskeletal   Abdominal   Peds  Hematology   Anesthesia Other Findings   Reproductive/Obstetrics                           Anesthesia Physical Anesthesia Plan  ASA: II  Anesthesia Plan: General   Post-op Pain Management:    Induction: Intravenous  Airway Management Planned: LMA  Additional Equipment:   Intra-op Plan:   Post-operative Plan:   Informed Consent: I have reviewed the patients History and Physical, chart, labs and discussed the procedure including the risks, benefits and alternatives for the proposed anesthesia with the patient or authorized representative who has indicated his/her understanding and acceptance.   Dental advisory given  Plan Discussed with: CRNA and Anesthesiologist  Anesthesia Plan Comments:         Anesthesia Quick Evaluation

## 2016-06-19 NOTE — Transfer of Care (Signed)
Immediate Anesthesia Transfer of Care Note  Patient: Megan Farmer  Procedure(s) Performed: Procedure(s): IRRIGATION AND DEBRIDEMENT EXTREMITY (Right)  Patient Location: PACU  Anesthesia Type:General  Level of Consciousness: awake, alert  and oriented  Airway & Oxygen Therapy: Patient Spontanous Breathing and Patient connected to nasal cannula oxygen  Post-op Assessment: Report given to RN, Post -op Vital signs reviewed and stable and Patient moving all extremities X 4  Post vital signs: Reviewed and stable  Last Vitals:  Vitals:   06/19/16 0730 06/19/16 0745  BP: 91/71   Pulse: (!) 108   Resp: (!) 30   Temp:  37.1 C  HR 98, RR 26, Sats 97%, BP 100/76  Last Pain:  Vitals:   06/19/16 0745  TempSrc: Oral  PainSc:          Complications: No apparent anesthesia complications

## 2016-06-19 NOTE — Brief Op Note (Signed)
06/18/2016 - 06/19/2016  9:26 AM  PATIENT:  Megan Farmer  20 y.o. female  PRE-OPERATIVE DIAGNOSIS:  RIGHT ARM ABSCESS  POST-OPERATIVE DIAGNOSIS:  RIGHT ARM EDEMA and RIGHT ELBOW EFFUSION  PROCEDURE:  Procedure(s): IRRIGATION AND DEBRIDEMENT EXTREMITY (Right)  FINDINGS:  No gross purulence; significant edema and myositis  SURGEON:  Surgeon(s) and Role:    * Kathryne Hitchhristopher Y Blackman, MD - Primary  PHYSICIAN ASSISTANT: Rexene EdisonGil Clark, PA-C  ANESTHESIA:   general  EBL:  Total I/O In: 400 [I.V.:400] Out: 25 [Blood:25]  COUNTS:  YES  TOURNIQUET:   Total Tourniquet Time Documented: Upper Arm (Right) - 11 minutes Total: Upper Arm (Right) - 11 minutes   DICTATION: .Other Dictation: Dictation Number [id number]  PLAN OF CARE: Admit to inpatient   PATIENT DISPOSITION:  PACU - guarded condition.   Delay start of Pharmacological VTE agent (>24hrs) due to surgical blood loss or risk of bleeding: no

## 2016-06-19 NOTE — Progress Notes (Signed)
Pt requests to go home AMA. It was explained to the patient about Levophed she is on and risks of stopping it. Pt states: "I do not have problem with BP".  MD (yacoub) called and notified. If we cannot wean Levo we might IVC patient for safety. (she is unable to leave at this time due to sedation and pain medication were given to her during and after surgery.) Upon return to the room to update, pt was asleep. Will check later.

## 2016-06-19 NOTE — ED Notes (Signed)
Levo started at 135mcg/min per critical care MD's order

## 2016-06-19 NOTE — Procedures (Signed)
Central Venous Catheter Insertion Procedure Note Megan Farmer [id number] 10/19/1995  Procedure: Insertion of Central Venous Catheter Indications: Assessment of intravascular volume, Drug and/or fluid administration and Frequent blood sampling  Procedure Details Consent: Risks of procedure as well as the alternatives and risks of each were explained to the (patient/caregiver).  Consent for procedure obtained. Time Out: Verified patient identification, verified procedure, site/side was marked, verified correct patient position, special equipment/implants available, medications/allergies/relevent history reviewed, required imaging and test results available.  Performed  Maximum sterile technique was used including antiseptics, cap, gloves, gown, hand hygiene, mask and sheet. Skin prep: Chlorhexidine; local anesthetic administered A antimicrobial bonded/coated triple lumen catheter was placed in the left subclavian vein using the Seldinger technique.  Evaluation Blood flow good Complications: No apparent complications Patient did tolerate procedure well. Chest X-ray ordered to verify placement.  CXR: normal.  GIDDINGS, [name]. 06/19/2016, 6:48 AM

## 2016-06-19 NOTE — Anesthesia Postprocedure Evaluation (Signed)
Anesthesia Post Note  Patient: Megan Farmer Megan Farmer  Procedure(s) Performed: Procedure(s) (LRB): IRRIGATION AND DEBRIDEMENT EXTREMITY (Right)  Patient location during evaluation: PACU Anesthesia Type: General Level of consciousness: awake, awake and alert and oriented Pain management: pain level controlled Vital Signs Assessment: post-procedure vital signs reviewed and stable Respiratory status: spontaneous breathing, nonlabored ventilation and respiratory function stable Cardiovascular status: blood pressure returned to baseline Anesthetic complications: no    Last Vitals:  Vitals:   06/19/16 1200 06/19/16 1300  BP: 98/72 105/83  Pulse: 93 90  Resp: (!) 28 (!) 28  Temp:      Last Pain:  Vitals:   06/19/16 1153  TempSrc: Oral  PainSc:                  Joylene Wescott COKER

## 2016-06-19 NOTE — Anesthesia Procedure Notes (Signed)
Procedure Name: LMA Insertion Date/Time: 06/19/2016 8:47 AM Performed by: Glo HerringLEE, Gearline Spilman B Pre-anesthesia Checklist: Patient identified, Emergency Drugs available, Suction available, Patient being monitored and Timeout performed Patient Re-evaluated:Patient Re-evaluated prior to inductionOxygen Delivery Method: Circle system utilized Preoxygenation: Pre-oxygenation with 100% oxygen Intubation Type: IV induction Ventilation: Mask ventilation without difficulty LMA: LMA inserted LMA Size: 4.0 Placement Confirmation: CO2 detector,  breath sounds checked- equal and bilateral and positive ETCO2 Tube secured with: Tape Dental Injury: Teeth and Oropharynx as per pre-operative assessment

## 2016-06-20 ENCOUNTER — Encounter (HOSPITAL_COMMUNITY): Payer: Self-pay | Admitting: Orthopaedic Surgery

## 2016-06-20 ENCOUNTER — Inpatient Hospital Stay (HOSPITAL_COMMUNITY): Payer: Medicaid Other

## 2016-06-20 DIAGNOSIS — Z7289 Other problems related to lifestyle: Secondary | ICD-10-CM

## 2016-06-20 DIAGNOSIS — R7881 Bacteremia: Secondary | ICD-10-CM

## 2016-06-20 DIAGNOSIS — F4489 Other dissociative and conversion disorders: Secondary | ICD-10-CM

## 2016-06-20 DIAGNOSIS — Z880 Allergy status to penicillin: Secondary | ICD-10-CM

## 2016-06-20 DIAGNOSIS — Z881 Allergy status to other antibiotic agents status: Secondary | ICD-10-CM

## 2016-06-20 DIAGNOSIS — F1410 Cocaine abuse, uncomplicated: Secondary | ICD-10-CM

## 2016-06-20 DIAGNOSIS — Z9889 Other specified postprocedural states: Secondary | ICD-10-CM

## 2016-06-20 DIAGNOSIS — E871 Hypo-osmolality and hyponatremia: Secondary | ICD-10-CM

## 2016-06-20 DIAGNOSIS — L02413 Cutaneous abscess of right upper limb: Secondary | ICD-10-CM

## 2016-06-20 DIAGNOSIS — F17200 Nicotine dependence, unspecified, uncomplicated: Secondary | ICD-10-CM

## 2016-06-20 DIAGNOSIS — R6520 Severe sepsis without septic shock: Secondary | ICD-10-CM

## 2016-06-20 LAB — BASIC METABOLIC PANEL
ANION GAP: 5 (ref 5–15)
BUN: 11 mg/dL (ref 6–20)
CALCIUM: 8 mg/dL — AB (ref 8.9–10.3)
CHLORIDE: 102 mmol/L (ref 101–111)
CO2: 26 mmol/L (ref 22–32)
Creatinine, Ser: 0.48 mg/dL (ref 0.44–1.00)
GFR calc non Af Amer: >60 mL/min (ref >60)
GLUCOSE: 117 mg/dL — AB (ref 65–99)
POTASSIUM: 3.4 mmol/L — AB (ref 3.5–5.1)
Sodium: 133 mmol/L — ABNORMAL LOW (ref 135–145)

## 2016-06-20 LAB — ECHOCARDIOGRAM COMPLETE
HEIGHTINCHES: 63 in
Weight: 1975.32 oz

## 2016-06-20 LAB — MAGNESIUM: MAGNESIUM: 1.9 mg/dL (ref 1.7–2.4)

## 2016-06-20 LAB — URINE CULTURE

## 2016-06-20 LAB — CBC
HEMATOCRIT: 25.6 % — AB (ref 36.0–46.0)
HEMOGLOBIN: 8.8 g/dL — AB (ref 12.0–15.0)
MCH: 26.5 pg (ref 26.0–34.0)
MCHC: 34.4 g/dL (ref 30.0–36.0)
MCV: 77.1 fL — AB (ref 78.0–100.0)
Platelets: 101 10*3/uL — ABNORMAL LOW (ref 150–400)
RBC: 3.32 MIL/uL — AB (ref 3.87–5.11)
RDW: 13.7 % (ref 11.5–15.5)
WBC: 4.8 10*3/uL (ref 4.0–10.5)

## 2016-06-20 LAB — PHOSPHORUS: Phosphorus: 3 mg/dL (ref 2.5–4.6)

## 2016-06-20 MED ORDER — ONDANSETRON HCL 4 MG/2ML IJ SOLN: 4.0000 mg | Freq: Four times a day (QID) | INTRAMUSCULAR | Status: DC | PRN

## 2016-06-20 MED ORDER — LACTATED RINGERS IV BOLUS (SEPSIS)
2000.0000 mL | Freq: Once | INTRAVENOUS | Status: AC
  Administered 2016-06-20: 1000 mL via INTRAVENOUS

## 2016-06-20 MED ORDER — DEXTROSE 5 % IV SOLN
2.0000 g | INTRAVENOUS | Status: DC
  Administered 2016-06-20 – 2016-06-22 (×3): 2 g via INTRAVENOUS
  Filled 2016-06-20 (×4): qty 2

## 2016-06-20 MED ORDER — FENTANYL CITRATE (PF) 100 MCG/2ML IJ SOLN
25.0000 ug | INTRAMUSCULAR | Status: DC | PRN
  Administered 2016-06-20 – 2016-06-23 (×26): 50 ug via INTRAVENOUS
  Filled 2016-06-20 (×25): qty 2

## 2016-06-20 MED ORDER — SODIUM CHLORIDE 0.9 % IV SOLN
30.0000 meq | Freq: Once | INTRAVENOUS | Status: AC
  Administered 2016-06-20: 30 meq via INTRAVENOUS
  Filled 2016-06-20: qty 15

## 2016-06-20 NOTE — Progress Notes (Signed)
  Echocardiogram 2D Echocardiogram has been performed.  Megan SavoyCasey N Marlee Farmer 06/20/2016, 3:17 PM

## 2016-06-20 NOTE — Progress Notes (Signed)
[facility] FOR AM LAB REPLACEMENT ONLY  The patient does apply [facility] the [facility] Protocol based on the criteria listed below:   1. Is GFR >/= 40 ml/min? Yes.    Patient's GFR today is >60 2. Is urine output >/= 0.5 ml/kg/hr for the last 6 hours? Yes.   Patient's UOP is 1.5 ml/kg/hr 3. Is BUN < 60 mg/dL? Yes.    Patient's BUN today is 11 4. Abnormal electrolyte(s): k 3.4 5. Ordered repletion with: protocol 6. If Farmer panic level lab has been reported, has the CCM MD in charge been notified? No..   Physician:    Megan Farmer, Megan Farmer 06/20/2016 4:47 AM

## 2016-06-20 NOTE — Progress Notes (Addendum)
Involuntary Commitment Paperwork completed, notarized, and faxed to [facility] In[facility]agistrate. CSW has confirmed receipt of paperwork with Magistrate. Police department to serve patient.          Lance MussAshley Gardner,MSW, LCSW [facility] Clinical Social Worker [phone]

## 2016-06-20 NOTE — Progress Notes (Signed)
Patient requested to purchase Fentanyl, and IV tubing.  I explained to patient no drugs are for sale, nor is any equipment.  Patient also requested Fentanyl be injected directly into her neck.  I explained we cannot honor her request.

## 2016-06-20 NOTE — Progress Notes (Signed)
PCCM Attending Note: Discussion with patient with door closed but curtains open. Patient tearful. Not oriented to president or place. Cannot tell me why she is in hospital or that she has a blood stream infection despite the fact that I have told her multiple times this morning. She is a danger to herself if she leaves. Plan for involuntary commitment given the risk to her life.  Donna ChristenJennings E. Jamison NeighborNestor, M.D. [facility] Pager:  [phone] After 3pm or if no response, call [phone] 11:43 AM 06/20/16

## 2016-06-20 NOTE — Progress Notes (Signed)
Patients states that she wants IV out and she wants to leave. Educated patient that she has IVC papers and that we are treating a blood infection and trying to keep her safe. Patient getting out of bed and taking off EKG leads. Restless and agitated. Security called and here to see patient.

## 2016-06-20 NOTE — Progress Notes (Signed)
Patient states that she doesn't know where she is takes off EKG monitor and getting out of bed without calling for assistance. Educations on safety done

## 2016-06-20 NOTE — Progress Notes (Signed)
Patient ID: Megan Farmer XXXPatterson, female   DOB: Dec 13, 1995, 20 y.o.   MRN: [id number] Tolerated surgery on right arm yesterday.  Exam on arm much improved today.  Moves her fingers more easily with less pain.  Compartments are soft.  Dressing is clean and intact.

## 2016-06-20 NOTE — Op Note (Signed)
NAMCornelia Farmer:  XXXPATTERSON, Megan        ACCOUNT NO.:  [id number]  MEDICAL RECORD NO.:  [id number]  LOCATION:  2M05C                        FACILITY:  [facility]  PHYSICIAN:  Vanita PandaChristopher Y. Magnus IvanBlackman, M.D.DATE OF BIRTH:  Aug 28, 1995  DATE OF PROCEDURE:  06/19/2016 DATE OF DISCHARGE:                              OPERATIVE REPORT   PREOPERATIVE DIAGNOSIS:  Right arm antecubital fossa abscess with a history of IV drug abuse.  POSTOPERATIVE DIAGNOSES: 1. Right antecubital fossa edema with no gross abscess status post IV     drug abuse. 2. Right elbow effusion.  PROCEDURES: 1. Irrigation of right antecubital fossa edema with assessment of the     surrounding muscles. 2. Aspiration of right elbow joint.  FINDINGS:  About 5 mL of clear fluid.  SURGEON:  Vanita PandaChristopher Y. Magnus IvanBlackman, M.D.  ASSISTANT:  Richardean CanalGilbert Clark, PA-C.  ANESTHESIA:  General.  BLOOD LOSS:  Less than 50 mL.  TOURNIQUET TIME:  Less than 15 minutes.  COMPLICATIONS:  None.  INDICATIONS:  Megan Farmer is a [age] year old with a history of IV drug abuse.  She was admitted through the emergency room last night to the ICU in fulminant sepsis.  She was having significant amount of right arm pain in the arm that she did inject drugs into, which she reports about 5 days ago.  Her pain is in the antecubital area where she injected. There is no fluctuance and no erythema, but certainly swelling.  The only study that was obtained was a CT scan that suggested an abscess of about 1 cm in the deep tissue, but deep and superficial tissue on the volar aspect with associated edema and lymphangitis in this area.  It also showed a mild elbow effusion.  On exam, she had no pain around the elbow joint itself.  I did recommend based on the CT scan and her exam at least exploring the antecubital fossa area and releasing any edematous tissue or abscess as appropriate.  The risks and benefits of this were explained to her, and she did understand the  need to proceed with surgery.  PROCEDURE DESCRIPTION:  After informed consent was obtained, appropriate right arm was marked.  She was brought to the operating room, placed supine on the operating table.  General anesthesia was then obtained. Her right arm was placed on arm table with a nonsterile tourniquet was placed around her upper right arm and prepped and draped from the upper arm down the fingers with DuraPrep and sterile drapes.  A time-out was called and she was identified as correct patient and correct right arm. We then had the tourniquet inflated to 250 mmHg of pressure.  I made an incision over the antecubital fossa and carried this proximally and distally.  I dissected down through the deep tissues and found only significant amounts of edema, but no abscesses.  I explored all the tissue in this area.  It was again a large area of edema, but no fulminant abscess.  I then irrigated the soft tissues thoroughly with a normal saline solution.  Once we irrigated this, I used an 18-gauge needle and went through the posterolateral aspect of the elbow and aspirated fluid from the elbow joint itself.  I then again irrigated the  soft tissue with normal saline solution.  We closed the subcutaneous tissue with interrupted 2-0 Vicryl suture, followed by interrupted 2-0 nylon on the skin.  Xeroform and well-padded sterile dressing was applied.  She was awakened, extubated, and taken back to the ICU in guarded condition.     Vanita Pandahristopher Y. Magnus IvanBlackman, M.D.     CYB/MEDQ  D:  06/19/2016  T:  06/20/2016  Job:  [id number]

## 2016-06-20 NOTE — Progress Notes (Signed)
PULMONARY / CRITICAL CARE MEDICINE   Name: Megan Farmer MRN: [id number] DOB: 1996-03-24    ADMISSION DATE:  06/18/2016 CONSULTATION DATE:  06/18/16  REFERRING MD:  ED physicican  CHIEF COMPLAINT:  Septic shock  HISTORY OF PRESENT ILLNESS:   Megan Farmer is a 20 y/o woman with a history of IV drug use, injects heroin and smokes cocaine.  Megan Farmer presents to the ED with complaint of right elbow pain, rash, cough and congestion, fever. Pt states approximately a week ago she injected right AC with crack cocaine "wash." She explains that is when a cotton is soaked and crack cocaine and vinegar, and then this mixture is drawn and injected. Megan Farmer states in her case this was old and she just had leftovers from a couple of days that she drew up and injected. She states since then she developed pain and swelling to the right elbow. She has pain with full extension of the joint. She is also reporting cough and congestion with green productive sputum for 1-2 weeks. She states today she broke out in a rash in her extremities. She believes she developed fever at some point today as well. She has been staying in a hotel with an elderly couple that picked her up that has dogs. States rash is itchy. She was given Benadryl by EMS prior to coming in. She reports no prior medical problems. She denies taking any medications prior to coming in.  Pt remained hypotensive despite 5L IV fluids and was eventually started on levophed and admitted to the ICU.   SUBJECTIVE: Megan Farmer remains on vasopressor. Continuing to ask for Fentanyl intermittently. Megan Farmer reporting significant pain. Denies any dyspnea or coughing. Denies any diarrhea but is having some nausea.   REVIEW OF SYSTEMS:  No fever or chills. No headache.   VITAL SIGNS: BP 94/71   Pulse 88   Temp 97.6 F (36.4 C) (Oral)   Resp 14   Ht 5\' 3"  (1.6 m)   Wt 123 lb 7.3 oz (56 kg)   LMP 05/29/2016   SpO2 96%   BMI 21.87 kg/m   HEMODYNAMICS:     VENTILATOR SETTINGS:    INTAKE / OUTPUT: I/O last 3 completed shifts: In: 7525.4 [I.V.:2080.4; IV Piggyback:5445] Out: 3525 [Urine:2975; Stool:525; Blood:25]  PHYSICAL EXAMINATION: General:  Anxious. Tearful. No acute distress.  Neuro:  CN grossly in tact. Grossly nonfocal. No meningismus.  HEENT:  Moist mucus membranes. No scleral icterus or injectino. Cardiovascular:  Regular rate. No edema. No JVD. Pulmonary:  Clear on auscultation. Normal work of breathing. Speaking in complete sentences. Abdomen:  Soft. Nontender. Normal bowel sounds.  Integument:  Warm & dry. No rash on exposed skin. Bandage around right forearm.   LABS:  BMET  Recent Labs Lab 06/18/16 2000 06/19/16 0304 06/20/16 0335  NA 125* 134* 133*  K 2.3* 3.3* 3.4*  CL 88* 105 102  CO2 26 22 26   BUN 5* 5* 11  CREATININE 0.83 0.61 0.48  GLUCOSE 112* 97 117*   Electrolytes  Recent Labs Lab 06/18/16 2000 06/19/16 0304 06/20/16 0335  CALCIUM 7.7* 7.4* 8.0*  MG  --   --  1.9  PHOS  --   --  3.0   CBC  Recent Labs Lab 06/18/16 2000 06/19/16 0304 06/20/16 0335  WBC 9.0 8.0 4.8  HGB 11.1* 10.7* 8.8*  HCT 32.3* 32.0* 25.6*  PLT 141* 107* 101*   Coag's No results for input(s): APTT, INR in the last 168 hours.  Sepsis Markers  Recent Labs Lab 06/18/16 2006 06/18/16 2251  LATICACIDVEN 2.22* 1.45   ABG No results for input(s): PHART, PCO2ART, PO2ART in the last 168 hours.  Liver Enzymes  Recent Labs Lab 06/18/16 2000  AST 44*  ALT 28  ALKPHOS 47  BILITOT 0.6  ALBUMIN 2.3*   Cardiac Enzymes No results for input(s): TROPONINI, PROBNP in the last 168 hours.  Glucose  Recent Labs Lab 06/19/16 0139 06/19/16 0747  GLUCAP 112* 108*   Imaging No results found.   STUDIES:  CT R Elbow W/ Contrast 12/2: IMPRESSION: 1. No evidence of fracture or dislocation. No evidence of osseous erosion. 2. Right elbow joint effusion, with mild peripheral enhancement, raising suspicion for  septic joint. 3. Apparent tiny 1.2 cm intramuscular abscess within the superficial musculature at the level of the antecubital fossa. 4. Diffuse thrombophlebitis involving multiple veins at and above the antecubital fossa. 5. Mild soft tissue edema tracking along the dorsum of the forearm and elbow.  MICROBIOLOGY: MRSA PCR 12/3:  Negative  Blood Ctx x2 12/2 >> Strep pyogenes by PCR Urine Ctx 12/2:  Multiple species Blood Ctx x1 12/3 >>  ANTIBIOTICS: Aztreonam 12/2 - 12/3 Levaquin 12/2 - 12/3 Vancomycin 12/2 >> Clindamycin 12/2 >>  SIGNIFICANT EVENTS: 12/3 - to the OR for I&D right elbow/arm  LINES/TUBES: L Subclavian TLC 12/3 >> PIV x2  ASSESSMENT / PLAN:  CARDIOVASCULAR A:  Shock - Likely sepsis. Sinus Tachycardia - Multifactorial.   P:  Continuous telemetry monitoring Vitals per unit protocol Weaning Levophed to maintain MAP >65  INFECTIOUS A:   Sepsis Streptococcus pyogenes Bacteremia Possible R Elbow Septic Joint  P:   Vancomycin Day #3 & Clindamycin Day #2 Awaiting culture finalization Checking Complete Echocardiogram Today Plan to repeat Blood Cultures tomorrow Consulting ID  MUSCULOSKELETAL A: Septic Right Elbow - S/P I & D 12/3.  P: Dr. Rayburn MaBlackmon Following & appreciate assistance  PULMONARY A: No acute issues.  P:   Continuous pulse oximetry  GASTROINTESTINAL A:   Nausea w/ Emesis - Likely due to withdrawal  P:   Heart Health Diet Zofran IV prn   HEMATOLOGIC A:   Anemia - Mild. No signs of active bleeding. Thrombocytopenia - Likely splenic sequestration with sepsis.  P:  Trending cell counts daily w/ CBC Lovenox [place] daily SCDs  RENAL A: Hypokalemia - Replaced. Hyponatremia - Mild.   P: Monitoring UOP Trending renal function & electrolytes daily Replacing electrolytes as indicated KCl 30mEq today  NEUROLOGICAL A: Acute Drug Withdrawal IV Drug Use - Cocaine w/ vinegar.   P: Fentanyl IV prn Ativan IV prn    ENDOCRINE  A: No acute issues.  P: Monitor glucose on daily labs.   FAMILY  - Updates: Megan Farmer updated bedside 12/4 by Dr. Jamison NeighborNestor.   - Inter-disciplinary family meet or Palliative Care meeting due by:  06/25/16  TODAY'S SUMMARY:  20 y.o. female with IVDU with fluid refractory shock 2/2 soft tissue infection of right arm. Sepsis due to bacteremia improving. Weaning pressor and continuing fluid resuscitation.   I have spent a total of 32 minutes of critical care time today caring for the Megan Farmer and reviewing the Megan Farmer's electronic medical record.   Donna ChristenJennings E. Jamison NeighborNestor, M.D. [facility] Pager:  [phone] After 3pm or if no response, call [phone] 06/20/2016, 10:23 AM

## 2016-06-20 NOTE — Consult Note (Signed)
[name] Infectious [facility]  Date of Admission:  06/18/2016  Date of Consult:  06/20/2016  Reason for Consult: group A strep abscesses, IVDA, Sepsis Referring Physician: Ninfa Linden  Impression/Recommendation Group A strep sepsis IVDA HIV - Hypokalemia, hyponatremia  Stop vanco, clinda Start ceftriaxone 2 g qday Repeat BCx 12-3 pending Needs TEE Check hep c/b Check HIV RNA Electrolyte supplementation per primary.   Comment- Her PEN allergy is > 10 years ago.  Will challenge her with cephalosporin while she is monitored.   Thank you so much for this interesting consult,   Bobby Rumpf (pager) [phone] [email or web address]  Megan Farmer is an 20 y.o. female.  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 Also with 1-2 weeks cough and congestion. On day of adm shw also developed full body rash.  In ED had temp 103.2, hypotension, and HR 140s. She was started on vanco/aztreonam/levaquin. She had CT of her elbow which showed a small abscess. On 12-3 she was taken to OR and debrided.  Today she is confused and involuntarily committed.    History reviewed. No pertinent past medical history.  Past Surgical History:  Procedure Laterality Date  . I&D EXTREMITY Right 06/19/2016   Procedure: IRRIGATION AND DEBRIDEMENT EXTREMITY;  Surgeon: Mcarthur Rossetti, MD;  Location: [facility];  Service: Orthopedics;  Laterality: Right;     Allergies  Allergen Reactions  . Amoxicillin Swelling  . Penicillins Swelling    Medications:  Scheduled: . acetaminophen  1,000 mg Oral Once  . clindamycin (CLEOCIN) IV  600 mg Intravenous Q8H  . enoxaparin (LOVENOX) injection  40 mg Subcutaneous Q24H  . fentaNYL (SUBLIMAZE) injection  50 mcg Intravenous Once  . midazolam  2 mg Intravenous Once  . sodium chloride flush  10-40 mL Intracatheter Q12H  . sodium chloride flush  3 mL Intravenous Q12H  . vancomycin   500 mg Intravenous Q8H    Abtx:  Anti-infectives    Start     Dose/Rate Route Frequency Ordered Stop   06/19/16 2100  levofloxacin (LEVAQUIN) IVPB 750 mg  Status:  Discontinued     750 mg 100 mL/hr over 90 Minutes Intravenous Every 24 hours 06/18/16 2048 06/19/16 1055   06/19/16 1200  clindamycin (CLEOCIN) IVPB 600 mg     600 mg 100 mL/hr over 30 Minutes Intravenous Every 8 hours 06/19/16 1057     06/19/16 1200  vancomycin (VANCOCIN) 500 mg in sodium chloride 0.9 % 100 mL IVPB     500 mg 100 mL/hr over 60 Minutes Intravenous Every 8 hours 06/19/16 1105     06/19/16 1000  vancomycin (VANCOCIN) IVPB 750 mg/150 ml premix  Status:  Discontinued     750 mg 150 mL/hr over 60 Minutes Intravenous Every 12 hours 06/18/16 2048 06/19/16 1104   06/19/16 0600  aztreonam (AZACTAM) 1 g in dextrose 5 % 50 mL IVPB  Status:  Discontinued     1 g 100 mL/hr over 30 Minutes Intravenous Every 8 hours 06/18/16 2048 06/19/16 1055   06/18/16 2045  levofloxacin (LEVAQUIN) IVPB 750 mg     750 mg 100 mL/hr over 90 Minutes Intravenous  Once 06/18/16 2030 06/18/16 2323   06/18/16 2045  aztreonam (AZACTAM) 2 g in dextrose 5 % 50 mL IVPB     2 g 100 mL/hr over 30 Minutes Intravenous  Once 06/18/16 2030 06/18/16 2229   06/18/16 2045  vancomycin (VANCOCIN) IVPB  1000 mg/200 mL premix     1,000 mg 200 mL/hr over 60 Minutes Intravenous  Once 06/18/16 2030 06/18/16 2326      Total days of antibiotics: 2 (clinda/vanco/levaquin)          Social History:  reports that she has been smoking.  She has never used smokeless tobacco. She reports that she uses drugs, including IV and Cocaine. She reports that she does not drink alcohol.  History reviewed. No pertinent family history.  General ROS: cough+, normal BM, normal urine. see HPI.   Blood pressure (!) 81/42, pulse (!) 102, temperature 97.5 F (36.4 C), temperature source Oral, resp. rate 17, height '5\' 3"'  (1.6 m), weight 56 kg (123 lb 7.3 oz), last menstrual  period 05/29/2016, SpO2 96 %. General appearance: alert and mild distress Eyes: negative findings: conjunctivae and sclerae normal and pupils equal, round, reactive to light and accomodation Throat: normal findings: oropharynx pink & moist without lesions or evidence of thrush Neck: no adenopathy and supple, symmetrical, trachea midline Lungs: clear to auscultation bilaterally Heart: regular rate and rhythm Abdomen: normal findings: bowel sounds normal and soft, non-tender Extremities: RUE elbow dressed. mild distal edema.  Neurologic: Mental status: alertness: alert, orientation: date, person, place, president   Results for orders placed or performed during the hospital encounter of 06/18/16 (from the past 48 hour(s))  Comprehensive metabolic panel     Status: Abnormal   Collection Time: 06/18/16  8:00 PM  Result Value Ref Range   Sodium 125 (L) 135 - 145 mmol/L   Potassium 2.3 (LL) 3.5 - 5.1 mmol/L    Comment: CRITICAL RESULT CALLED TO, READ BACK BY AND VERIFIED WITH: RN C PRICE AT 2040 [phone] MARTINB    Chloride 88 (L) 101 - 111 mmol/L   CO2 26 22 - 32 mmol/L   Glucose, Bld 112 (H) 65 - 99 mg/dL   BUN 5 (L) 6 - 20 mg/dL   Creatinine, Ser 0.83 0.44 - 1.00 mg/dL   Calcium 7.7 (L) 8.9 - 10.3 mg/dL   Total Protein 5.7 (L) 6.5 - 8.1 g/dL   Albumin 2.3 (L) 3.5 - 5.0 g/dL   AST 44 (H) 15 - 41 U/L   ALT 28 14 - 54 U/L   Alkaline Phosphatase 47 38 - 126 U/L   Total Bilirubin 0.6 0.3 - 1.2 mg/dL   GFR calc non Af Amer >60 >60 mL/min   GFR calc Af Amer >60 >60 mL/min    Comment: (NOTE) The eGFR has been calculated using the CKD EPI equation. This calculation has not been validated in all clinical situations. eGFR's persistently <60 mL/min signify possible Chronic Kidney Disease.    Anion gap 11 5 - 15  CBC with Differential     Status: Abnormal   Collection Time: 06/18/16  8:00 PM  Result Value Ref Range   WBC 9.0 4.0 - 10.5 K/uL   RBC 4.15 3.87 - 5.11 MIL/uL   Hemoglobin  11.1 (L) 12.0 - 15.0 g/dL   HCT 32.3 (L) 36.0 - 46.0 %   MCV 77.8 (L) 78.0 - 100.0 fL   MCH 26.7 26.0 - 34.0 pg   MCHC 34.4 30.0 - 36.0 g/dL   RDW 13.4 11.5 - 15.5 %   Platelets 141 (L) 150 - 400 K/uL   Neutrophils Relative % 86 %   Neutro Abs 7.8 (H) 1.7 - 7.7 K/uL   Lymphocytes Relative 10 %   Lymphs Abs 0.9 0.7 - 4.0 K/uL   Monocytes  Relative 4 %   Monocytes Absolute 0.3 0.1 - 1.0 K/uL   Eosinophils Relative 0 %   Eosinophils Absolute 0.0 0.0 - 0.7 K/uL   Basophils Relative 0 %   Basophils Absolute 0.0 0.0 - 0.1 K/uL  Urinalysis, Routine w reflex microscopic     Status: Abnormal   Collection Time: 06/18/16  8:01 PM  Result Value Ref Range   Color, Urine YELLOW YELLOW   APPearance CLOUDY (A) CLEAR   Specific Gravity, Urine 1.019 1.005 - 1.030   pH 6.0 5.0 - 8.0   Glucose, UA NEGATIVE NEGATIVE mg/dL   Hgb urine dipstick NEGATIVE NEGATIVE   Bilirubin Urine NEGATIVE NEGATIVE   Ketones, ur NEGATIVE NEGATIVE mg/dL   Protein, ur 100 (A) NEGATIVE mg/dL   Nitrite NEGATIVE NEGATIVE   Leukocytes, UA SMALL (A) NEGATIVE  Urine microscopic-add on     Status: Abnormal   Collection Time: 06/18/16  8:01 PM  Result Value Ref Range   Squamous Epithelial / LPF 6-30 (A) NONE SEEN   WBC, UA 6-30 0 - 5 WBC/hpf   RBC / HPF 0-5 0 - 5 RBC/hpf   Bacteria, UA MANY (A) NONE SEEN   Casts HYALINE CASTS (A) NEGATIVE   Urine-Other MUCOUS PRESENT   I-Stat CG4 Lactic Acid, ED     Status: Abnormal   Collection Time: 06/18/16  8:06 PM  Result Value Ref Range   Lactic Acid, Venous 2.22 (HH) 0.5 - 1.9 mmol/L   Comment NOTIFIED PHYSICIAN   Blood Culture (routine x 2)     Status: Abnormal (Preliminary result)   Collection Time: 06/18/16  8:12 PM  Result Value Ref Range   Specimen Description BLOOD LEFT HAND    Special Requests IN PEDIATRIC BOTTLE 4CC    Culture  Setup Time      GRAM POSITIVE COCCI IN CHAINS IN PEDIATRIC BOTTLE CRITICAL RESULT CALLED TO, READ BACK BY AND VERIFIED WITH: R RUMBARGER   06/19/16 @ 51 M VESTAL    Culture GROUP A STREP (S.PYOGENES) ISOLATED (A)    Report Status PENDING   Blood Culture ID Panel (Reflexed)     Status: Abnormal   Collection Time: 06/18/16  8:12 PM  Result Value Ref Range   Enterococcus species NOT DETECTED NOT DETECTED   Listeria monocytogenes NOT DETECTED NOT DETECTED   Staphylococcus species NOT DETECTED NOT DETECTED   Staphylococcus aureus NOT DETECTED NOT DETECTED   Streptococcus species DETECTED (A) NOT DETECTED    Comment: CRITICAL RESULT CALLED TO, READ BACK BY AND VERIFIED WITH: R RUMBARGER  06/19/16 @ 1050 M VESTAL    Streptococcus agalactiae NOT DETECTED NOT DETECTED   Streptococcus pneumoniae NOT DETECTED NOT DETECTED   Streptococcus pyogenes DETECTED (A) NOT DETECTED    Comment: CRITICAL RESULT CALLED TO, READ BACK BY AND VERIFIED WITH: R RUMBARGER  06/19/16 @ 1050 M VESTAL    Acinetobacter baumannii NOT DETECTED NOT DETECTED   Enterobacteriaceae species NOT DETECTED NOT DETECTED   Enterobacter cloacae complex NOT DETECTED NOT DETECTED   Escherichia coli NOT DETECTED NOT DETECTED   Klebsiella oxytoca NOT DETECTED NOT DETECTED   Klebsiella pneumoniae NOT DETECTED NOT DETECTED   Proteus species NOT DETECTED NOT DETECTED   Serratia marcescens NOT DETECTED NOT DETECTED   Haemophilus influenzae NOT DETECTED NOT DETECTED   Neisseria meningitidis NOT DETECTED NOT DETECTED   Pseudomonas aeruginosa NOT DETECTED NOT DETECTED   Candida albicans NOT DETECTED NOT DETECTED   Candida glabrata NOT DETECTED NOT DETECTED   Candida krusei  NOT DETECTED NOT DETECTED   Candida parapsilosis NOT DETECTED NOT DETECTED   Candida tropicalis NOT DETECTED NOT DETECTED  Urine culture     Status: Abnormal   Collection Time: 06/18/16  8:27 PM  Result Value Ref Range   Specimen Description URINE, CLEAN CATCH    Special Requests NONE    Culture MULTIPLE SPECIES PRESENT, SUGGEST RECOLLECTION (A)    Report Status 06/20/2016 FINAL   Blood Culture  (routine x 2)     Status: Abnormal (Preliminary result)   Collection Time: 06/18/16  8:36 PM  Result Value Ref Range   Specimen Description BLOOD BLOOD LEFT FOREARM    Special Requests BOTTLES DRAWN AEROBIC AND ANAEROBIC 5CC    Culture  Setup Time      GRAM POSITIVE COCCI IN CHAINS IN BOTH AEROBIC AND ANAEROBIC BOTTLES CRITICAL VALUE NOTED.  VALUE IS CONSISTENT WITH PREVIOUSLY REPORTED AND CALLED VALUE.    Culture GROUP A STREP (S.PYOGENES) ISOLATED (A)    Report Status PENDING   I-Stat Troponin, ED (not at [facility])     Status: None   Collection Time: 06/18/16  8:41 PM  Result Value Ref Range   Troponin i, poc 0.04 0.00 - 0.08 ng/mL   Comment 3            Comment: Due to the release kinetics of cTnI, a negative result within the first hours of the onset of symptoms does not rule out myocardial infarction with certainty. If myocardial infarction is still suspected, repeat the test at appropriate intervals.   I-Stat Beta hCG blood, ED ([facility], [facility], [facility] only)     Status: None   Collection Time: 06/18/16  8:41 PM  Result Value Ref Range   I-stat hCG, quantitative <5.0 <5 mIU/mL   Comment 3            Comment:   GEST. AGE      CONC.  (mIU/mL)   <=1 WEEK        5 - 50     2 WEEKS       50 - 500     3 WEEKS       100 - 10,000     4 WEEKS     1,000 - 30,000        FEMALE AND NON-PREGNANT FEMALE:     LESS THAN 5 mIU/mL   Rapid urine drug screen (hospital performed)     Status: Abnormal   Collection Time: 06/18/16  8:47 PM  Result Value Ref Range   Opiates POSITIVE (A) NONE DETECTED   Cocaine POSITIVE (A) NONE DETECTED   Benzodiazepines NONE DETECTED NONE DETECTED   Amphetamines NONE DETECTED NONE DETECTED   Tetrahydrocannabinol NONE DETECTED NONE DETECTED   Barbiturates NONE DETECTED NONE DETECTED    Comment:        DRUG SCREEN FOR MEDICAL PURPOSES ONLY.  IF CONFIRMATION IS NEEDED FOR ANY PURPOSE, NOTIFY LAB WITHIN 5 DAYS.        LOWEST DETECTABLE LIMITS FOR URINE DRUG SCREEN Drug  Class       Cutoff (ng/mL) Amphetamine      1000 Barbiturate      200 Benzodiazepine   384 Tricyclics       536 Opiates          300 Cocaine          300 THC              50   I-Stat CG4 Lactic Acid, ED  Status: None   Collection Time: 06/18/16 10:51 PM  Result Value Ref Range   Lactic Acid, Venous 1.45 0.5 - 1.9 mmol/L  MRSA PCR Screening     Status: None   Collection Time: 06/19/16  1:26 AM  Result Value Ref Range   MRSA by PCR NEGATIVE NEGATIVE    Comment:        The GeneXpert MRSA Assay (FDA approved for NASAL specimens only), is one component of a comprehensive MRSA colonization surveillance program. It is not intended to diagnose MRSA infection nor to guide or monitor treatment for MRSA infections.   Glucose, capillary     Status: Abnormal   Collection Time: 06/19/16  1:39 AM  Result Value Ref Range   Glucose-Capillary 112 (H) 65 - 99 mg/dL   Comment 1 Notify RN   Culture, blood (single)     Status: None (Preliminary result)   Collection Time: 06/19/16  2:59 AM  Result Value Ref Range   Specimen Description BLOOD LEFT HAND    Special Requests IN PEDIATRIC BOTTLE 1CC    Culture NO GROWTH 1 DAY    Report Status PENDING   CBC     Status: Abnormal   Collection Time: 06/19/16  3:04 AM  Result Value Ref Range   WBC 8.0 4.0 - 10.5 K/uL   RBC 4.11 3.87 - 5.11 MIL/uL   Hemoglobin 10.7 (L) 12.0 - 15.0 g/dL   HCT 32.0 (L) 36.0 - 46.0 %   MCV 77.9 (L) 78.0 - 100.0 fL   MCH 26.0 26.0 - 34.0 pg   MCHC 33.4 30.0 - 36.0 g/dL   RDW 14.0 11.5 - 15.5 %   Platelets 107 (L) 150 - 400 K/uL    Comment: REPEATED TO VERIFY SPECIMEN CHECKED FOR CLOTS PLATELET COUNT CONFIRMED BY SMEAR   Basic metabolic panel     Status: Abnormal   Collection Time: 06/19/16  3:04 AM  Result Value Ref Range   Sodium 134 (L) 135 - 145 mmol/L    Comment: DELTA CHECK NOTED   Potassium 3.3 (L) 3.5 - 5.1 mmol/L    Comment: DELTA CHECK NOTED   Chloride 105 101 - 111 mmol/L   CO2 22 22 - 32  mmol/L   Glucose, Bld 97 65 - 99 mg/dL   BUN 5 (L) 6 - 20 mg/dL   Creatinine, Ser 0.61 0.44 - 1.00 mg/dL   Calcium 7.4 (L) 8.9 - 10.3 mg/dL   GFR calc non Af Amer >60 >60 mL/min   GFR calc Af Amer >60 >60 mL/min    Comment: (NOTE) The eGFR has been calculated using the CKD EPI equation. This calculation has not been validated in all clinical situations. eGFR's persistently <60 mL/min signify possible Chronic Kidney Disease.    Anion gap 7 5 - 15  HIV antibody     Status: None   Collection Time: 06/19/16  3:04 AM  Result Value Ref Range   HIV Screen 4th Generation wRfx Non Reactive Non Reactive    Comment: (NOTE) Performed At: [facility] [address] [place], [place] [id number] Lindon Romp MD MP:[phone]   Glucose, capillary     Status: Abnormal   Collection Time: 06/19/16  7:47 AM  Result Value Ref Range   Glucose-Capillary 108 (H) 65 - 99 mg/dL   Comment 1 Notify RN    Comment 2 Document in Chart   BMET in AM     Status: Abnormal   Collection Time: 06/20/16  3:35 AM  Result Value Ref Range   Sodium 133 (L) 135 - 145 mmol/L   Potassium 3.4 (L) 3.5 - 5.1 mmol/L   Chloride 102 101 - 111 mmol/L   CO2 26 22 - 32 mmol/L   Glucose, Bld 117 (H) 65 - 99 mg/dL   BUN 11 6 - 20 mg/dL   Creatinine, Ser 0.48 0.44 - 1.00 mg/dL   Calcium 8.0 (L) 8.9 - 10.3 mg/dL   GFR calc non Af Amer >60 >60 mL/min   GFR calc Af Amer >60 >60 mL/min    Comment: (NOTE) The eGFR has been calculated using the CKD EPI equation. This calculation has not been validated in all clinical situations. eGFR's persistently <60 mL/min signify possible Chronic Kidney Disease.    Anion gap 5 5 - 15  CBC     Status: Abnormal   Collection Time: 06/20/16  3:35 AM  Result Value Ref Range   WBC 4.8 4.0 - 10.5 K/uL   RBC 3.32 (L) 3.87 - 5.11 MIL/uL   Hemoglobin 8.8 (L) 12.0 - 15.0 g/dL   HCT 25.6 (L) 36.0 - 46.0 %   MCV 77.1 (L) 78.0 - 100.0 fL   MCH 26.5 26.0 - 34.0 pg   MCHC 34.4 30.0 - 36.0  g/dL   RDW 13.7 11.5 - 15.5 %   Platelets 101 (L) 150 - 400 K/uL    Comment: CONSISTENT WITH PREVIOUS RESULT  Magnesium     Status: None   Collection Time: 06/20/16  3:35 AM  Result Value Ref Range   Magnesium 1.9 1.7 - 2.4 mg/dL  Phosphorus     Status: None   Collection Time: 06/20/16  3:35 AM  Result Value Ref Range   Phosphorus 3.0 2.5 - 4.6 mg/dL      Component Value Date/Time   SDES BLOOD LEFT HAND 06/19/2016 0259   SPECREQUEST IN PEDIATRIC BOTTLE 1CC 06/19/2016 0259   CULT NO GROWTH 1 DAY 06/19/2016 0259   REPTSTATUS PENDING 06/19/2016 0259   Dg Chest 2 View  Result Date: 06/18/2016 CLINICAL DATA:  Pt has had congestion with a productive cough w/yellowish phlegm. She was trying to inject crack intraveniously and missed her vein. She said her whole elbow feels tight and she's unable to straighten her arm out. EXAM: CHEST  2 VIEW COMPARISON:  None. FINDINGS: The heart size and mediastinal contours are within normal limits. Both lungs are clear. No pleural effusion or pneumothorax. The visualized skeletal structures are unremarkable. IMPRESSION: No active cardiopulmonary disease. Electronically Signed   By: Lajean Manes M.D.   On: 06/18/2016 21:17   Dg Elbow Complete Right  Result Date: 06/18/2016 CLINICAL DATA:  Pt has had congestion with a productive cough w/yellowish phlegm. She was trying to inject crack intraveniously and missed her vein. She said her whole elbow feels tight and she's unable to straighten her arm out. EXAM: RIGHT ELBOW - COMPLETE 3+ VIEW COMPARISON:  None. FINDINGS: No fracture.  No bone lesion. The elbow joint is normally spaced and aligned. No arthropathic change. No joint effusion. There is subcutaneous soft tissue edema/cellulitis most evident along the dorsal aspect of the elbow and proximal forearm. No soft tissue air. IMPRESSION: 1. No fracture, bone lesion or elbow joint abnormality. 2. Soft tissue edema/cellulitis. Electronically Signed   By: Lajean Manes  M.D.   On: 06/18/2016 21:18   Ct [place] Right W Contrast  Result Date: 06/18/2016 CLINICAL DATA:  Acute onset of right elbow pain and swelling. Initial encounter. EXAM: CT  OF THE UPPER RIGHT EXTREMITY WITH CONTRAST TECHNIQUE: Multidetector CT imaging of the upper right extremity was performed according to the standard protocol following intravenous contrast administration. COMPARISON:  None. CONTRAST:  12m ISOVUE-300 IOPAMIDOL (ISOVUE-300) INJECTION 61% FINDINGS: Bones/Joint/Cartilage There is no evidence of fracture or dislocation. No osseous erosion is seen. Note is made of a right elbow joint effusion, with mild peripheral enhancement, raising suspicion for septic joint. Ligaments Suboptimally assessed by CT. Muscles and Tendons An apparent tiny 1.2 x 0.8 cm intramuscular abscess is seen within the superficial musculature at the level of the antecubital fossa. The visualized musculature is otherwise grossly unremarkable. The visualized tendon structures are grossly unremarkable. Soft tissues Mild soft tissue edema is seen tracking along the dorsum of the forearm and elbow. There appears to be diffuse thrombophlebitis involving multiple veins at and above the antecubital fossa. IMPRESSION: 1. No evidence of fracture or dislocation. No evidence of osseous erosion. 2. Right elbow joint effusion, with mild peripheral enhancement, raising suspicion for septic joint. 3. Apparent tiny 1.2 cm intramuscular abscess within the superficial musculature at the level of the antecubital fossa. 4. Diffuse thrombophlebitis involving multiple veins at and above the antecubital fossa. 5. Mild soft tissue edema tracking along the dorsum of the forearm and elbow. Electronically Signed   By: JGarald BaldingM.D.   On: 06/18/2016 22:40   Dg Chest Port 1 View  Result Date: 06/19/2016 CLINICAL DATA:  Central line placement.  Initial encounter. EXAM: PORTABLE CHEST 1 VIEW COMPARISON:  Chest radiograph performed 06/18/2016  FINDINGS: The lungs are well-aerated. Vascular congestion is noted. There is no evidence of focal opacification, pleural effusion or pneumothorax. The cardiomediastinal silhouette is within normal limits. No acute osseous abnormalities are seen. A left subclavian line is noted ending about the mid SVC. IMPRESSION: Vascular congestion noted.  Lungs remain grossly clear. Electronically Signed   By: JGarald BaldingM.D.   On: 06/19/2016 04:21   Recent Results (from the past 240 hour(s))  Blood Culture (routine x 2)     Status: Abnormal (Preliminary result)   Collection Time: 06/18/16  8:12 PM  Result Value Ref Range Status   Specimen Description BLOOD LEFT HAND  Final   Special Requests IN PEDIATRIC BOTTLE 4CC  Final   Culture  Setup Time   Final    GRAM POSITIVE COCCI IN CHAINS IN PEDIATRIC BOTTLE CRITICAL RESULT CALLED TO, READ BACK BY AND VERIFIED WITH: R RUMBARGER  06/19/16 @ 185M VESTAL    Culture GROUP A STREP (S.PYOGENES) ISOLATED (A)  Final   Report Status PENDING  Incomplete  Blood Culture ID Panel (Reflexed)     Status: Abnormal   Collection Time: 06/18/16  8:12 PM  Result Value Ref Range Status   Enterococcus species NOT DETECTED NOT DETECTED Final   Listeria monocytogenes NOT DETECTED NOT DETECTED Final   Staphylococcus species NOT DETECTED NOT DETECTED Final   Staphylococcus aureus NOT DETECTED NOT DETECTED Final   Streptococcus species DETECTED (A) NOT DETECTED Final    Comment: CRITICAL RESULT CALLED TO, READ BACK BY AND VERIFIED WITH: R RUMBARGER  06/19/16 @ 1050 M VESTAL    Streptococcus agalactiae NOT DETECTED NOT DETECTED Final   Streptococcus pneumoniae NOT DETECTED NOT DETECTED Final   Streptococcus pyogenes DETECTED (A) NOT DETECTED Final    Comment: CRITICAL RESULT CALLED TO, READ BACK BY AND VERIFIED WITH: R RUMBARGER  06/19/16 @ 1050 M VESTAL    Acinetobacter baumannii NOT DETECTED NOT DETECTED Final   Enterobacteriaceae  species NOT DETECTED NOT DETECTED Final    Enterobacter cloacae complex NOT DETECTED NOT DETECTED Final   Escherichia coli NOT DETECTED NOT DETECTED Final   Klebsiella oxytoca NOT DETECTED NOT DETECTED Final   Klebsiella pneumoniae NOT DETECTED NOT DETECTED Final   Proteus species NOT DETECTED NOT DETECTED Final   Serratia marcescens NOT DETECTED NOT DETECTED Final   Haemophilus influenzae NOT DETECTED NOT DETECTED Final   Neisseria meningitidis NOT DETECTED NOT DETECTED Final   Pseudomonas aeruginosa NOT DETECTED NOT DETECTED Final   Candida albicans NOT DETECTED NOT DETECTED Final   Candida glabrata NOT DETECTED NOT DETECTED Final   Candida krusei NOT DETECTED NOT DETECTED Final   Candida parapsilosis NOT DETECTED NOT DETECTED Final   Candida tropicalis NOT DETECTED NOT DETECTED Final  Urine culture     Status: Abnormal   Collection Time: 06/18/16  8:27 PM  Result Value Ref Range Status   Specimen Description URINE, CLEAN CATCH  Final   Special Requests NONE  Final   Culture MULTIPLE SPECIES PRESENT, SUGGEST RECOLLECTION (A)  Final   Report Status 06/20/2016 FINAL  Final  Blood Culture (routine x 2)     Status: Abnormal (Preliminary result)   Collection Time: 06/18/16  8:36 PM  Result Value Ref Range Status   Specimen Description BLOOD BLOOD LEFT FOREARM  Final   Special Requests BOTTLES DRAWN AEROBIC AND ANAEROBIC 5CC  Final   Culture  Setup Time   Final    GRAM POSITIVE COCCI IN CHAINS IN BOTH AEROBIC AND ANAEROBIC BOTTLES CRITICAL VALUE NOTED.  VALUE IS CONSISTENT WITH PREVIOUSLY REPORTED AND CALLED VALUE.    Culture GROUP A STREP (S.PYOGENES) ISOLATED (A)  Final   Report Status PENDING  Incomplete  MRSA PCR Screening     Status: None   Collection Time: 06/19/16  1:26 AM  Result Value Ref Range Status   MRSA by PCR NEGATIVE NEGATIVE Final    Comment:        The GeneXpert MRSA Assay (FDA approved for NASAL specimens only), is one component of a comprehensive MRSA colonization surveillance program. It is  not intended to diagnose MRSA infection nor to guide or monitor treatment for MRSA infections.   Culture, blood (single)     Status: None (Preliminary result)   Collection Time: 06/19/16  2:59 AM  Result Value Ref Range Status   Specimen Description BLOOD LEFT HAND  Final   Special Requests IN PEDIATRIC BOTTLE 1CC  Final   Culture NO GROWTH 1 DAY  Final   Report Status PENDING  Incomplete      06/20/2016, 3:31 PM     LOS: 2 days    Records and images were personally reviewed where available.

## 2016-06-21 DIAGNOSIS — M009 Pyogenic arthritis, unspecified: Secondary | ICD-10-CM

## 2016-06-21 DIAGNOSIS — B950 Streptococcus, group A, as the cause of diseases classified elsewhere: Secondary | ICD-10-CM

## 2016-06-21 DIAGNOSIS — R7881 Bacteremia: Secondary | ICD-10-CM

## 2016-06-21 LAB — GLUCOSE, CAPILLARY
GLUCOSE-CAPILLARY: 80 mg/dL (ref 65–99)
Glucose-Capillary: 78 mg/dL (ref 65–99)

## 2016-06-21 LAB — CULTURE, BLOOD (ROUTINE X 2)

## 2016-06-21 LAB — CBC WITH DIFFERENTIAL/PLATELET
BASOS ABS: 0 10*3/uL (ref 0.0–0.1)
Basophils Relative: 0 %
Eosinophils Absolute: 0.1 10*3/uL (ref 0.0–0.7)
Eosinophils Relative: 1 %
HEMATOCRIT: 28.5 % — AB (ref 36.0–46.0)
Hemoglobin: 9.6 g/dL — ABNORMAL LOW (ref 12.0–15.0)
LYMPHS ABS: 2.5 10*3/uL (ref 0.7–4.0)
Lymphocytes Relative: 36 %
MCH: 26.3 pg (ref 26.0–34.0)
MCHC: 33.7 g/dL (ref 30.0–36.0)
MCV: 78.1 fL (ref 78.0–100.0)
MONOS PCT: 7 %
Monocytes Absolute: 0.5 10*3/uL (ref 0.1–1.0)
NEUTROS PCT: 56 %
Neutro Abs: 3.8 10*3/uL (ref 1.7–7.7)
PLATELETS: 136 10*3/uL — AB (ref 150–400)
RBC: 3.65 MIL/uL — AB (ref 3.87–5.11)
RDW: 13.8 % (ref 11.5–15.5)
WBC: 6.9 10*3/uL (ref 4.0–10.5)

## 2016-06-21 LAB — RENAL FUNCTION PANEL
ALBUMIN: 1.7 g/dL — AB (ref 3.5–5.0)
Anion gap: 5 (ref 5–15)
BUN: 13 mg/dL (ref 6–20)
CO2: 26 mmol/L (ref 22–32)
CREATININE: 0.66 mg/dL (ref 0.44–1.00)
Calcium: 7.9 mg/dL — ABNORMAL LOW (ref 8.9–10.3)
Chloride: 103 mmol/L (ref 101–111)
GFR calc Af Amer: >60 mL/min (ref >60)
Glucose, Bld: 79 mg/dL (ref 65–99)
PHOSPHORUS: 3.5 mg/dL (ref 2.5–4.6)
POTASSIUM: 3.1 mmol/L — AB (ref 3.5–5.1)
Sodium: 134 mmol/L — ABNORMAL LOW (ref 135–145)

## 2016-06-21 LAB — RPR: RPR Ser Ql: NONREACTIVE

## 2016-06-21 LAB — MAGNESIUM: MAGNESIUM: 2 mg/dL (ref 1.7–2.4)

## 2016-06-21 LAB — HEPATITIS PANEL, ACUTE
HCV Ab: >11 s/co ratio — ABNORMAL HIGH (ref 0.0–0.9)
HEP A IGM: NEGATIVE
HEP B C IGM: NEGATIVE
HEP B S AG: NEGATIVE

## 2016-06-21 LAB — HIV-1 RNA QUANT-NO REFLEX-BLD
HIV 1 RNA Quant: <20 copies/mL
LOG10 HIV-1 RNA: UNDETERMINED log10copy/mL

## 2016-06-21 LAB — GC/CHLAMYDIA PROBE AMP (~~LOC~~) NOT AT ARMC
Chlamydia: NEGATIVE
NEISSERIA GONORRHEA: NEGATIVE

## 2016-06-21 MED ORDER — POTASSIUM CHLORIDE CRYS ER 20 MEQ PO TBCR
30.0000 meq | EXTENDED_RELEASE_TABLET | ORAL | Status: AC
  Administered 2016-06-21 (×2): 30 meq via ORAL
  Filled 2016-06-21 (×2): qty 1

## 2016-06-21 NOTE — Care Management Note (Addendum)
Case Management Note  Patient Details  Name: Megan Farmer MRN: [id number] Date of Birth: 1995-09-25  Subjective/Objective:         Pt admitted with septic shock - positive for both heroin and cocaine           Action/Plan:  Social situation/living situation/substance abuse consueling under assessment by CSW.  Pt is confused and CM unable to obtain any pertinent information from pt.   No family/friends at bedside.  CM will continue to follow for discharge needs   Expected Discharge Date:                  Expected Discharge Plan:     In-House Referral:  Clinical Social Work (current substance abuse)  Discharge planning Services  CM Consult  Post Acute Care Choice:    Choice offered to:     DME Arranged:    DME Agency:     HH Arranged:    HH Agency:     Status of Service:  In process, will continue to follow  If discussed at Long Length of Stay Meetings, dates discussed:    Additional Comments:  Megan Farmer, Megan Dampier S, RN 06/21/2016, 3:20 PM

## 2016-06-21 NOTE — Progress Notes (Signed)
INFECTIOUS DISEASE PROGRESS NOTE  ID: Megan Farmer is a 20 y.o. female with  Principal Problem:   Sepsis associated hypotension (HCC) Active Problems:   IVDU (intravenous drug user)   Septic shock (HCC)   Cellulitis of right arm  Subjective: Wants to go home  Abtx:  Anti-infectives    Start     Dose/Rate Route Frequency Ordered Stop   06/20/16 1700  cefTRIAXone (ROCEPHIN) 2 g in dextrose 5 % 50 mL IVPB     2 g 100 mL/hr over 30 Minutes Intravenous Every 24 hours 06/20/16 1553     06/19/16 2100  levofloxacin (LEVAQUIN) IVPB 750 mg  Status:  Discontinued     750 mg 100 mL/hr over 90 Minutes Intravenous Every 24 hours 06/18/16 2048 06/19/16 1055   06/19/16 1200  clindamycin (CLEOCIN) IVPB 600 mg  Status:  Discontinued     600 mg 100 mL/hr over 30 Minutes Intravenous Every 8 hours 06/19/16 1057 06/20/16 1553   06/19/16 1200  vancomycin (VANCOCIN) 500 mg in sodium chloride 0.9 % 100 mL IVPB  Status:  Discontinued     500 mg 100 mL/hr over 60 Minutes Intravenous Every 8 hours 06/19/16 1105 06/20/16 1553   06/19/16 1000  vancomycin (VANCOCIN) IVPB 750 mg/150 ml premix  Status:  Discontinued     750 mg 150 mL/hr over 60 Minutes Intravenous Every 12 hours 06/18/16 2048 06/19/16 1104   06/19/16 0600  aztreonam (AZACTAM) 1 g in dextrose 5 % 50 mL IVPB  Status:  Discontinued     1 g 100 mL/hr over 30 Minutes Intravenous Every 8 hours 06/18/16 2048 06/19/16 1055   06/18/16 2045  levofloxacin (LEVAQUIN) IVPB 750 mg     750 mg 100 mL/hr over 90 Minutes Intravenous  Once 06/18/16 2030 06/18/16 2323   06/18/16 2045  aztreonam (AZACTAM) 2 g in dextrose 5 % 50 mL IVPB     2 g 100 mL/hr over 30 Minutes Intravenous  Once 06/18/16 2030 06/18/16 2229   06/18/16 2045  vancomycin (VANCOCIN) IVPB 1000 mg/200 mL premix     1,000 mg 200 mL/hr over 60 Minutes Intravenous  Once 06/18/16 2030 06/18/16 2326      Medications:  Scheduled: . acetaminophen  1,000 mg Oral Once  . cefTRIAXone  (ROCEPHIN)  IV  2 g Intravenous Q24H  . enoxaparin (LOVENOX) injection  40 mg Subcutaneous Q24H  . fentaNYL (SUBLIMAZE) injection  50 mcg Intravenous Once  . midazolam  2 mg Intravenous Once  . sodium chloride flush  10-40 mL Intracatheter Q12H  . sodium chloride flush  3 mL Intravenous Q12H    Objective: Vital signs in last 24 hours: Temp:  [97.4 F (36.3 C)-98.2 F (36.8 C)] 98.2 F (36.8 C) (12/05 0754) Pulse Rate:  [76-111] 92 (12/05 1000) Resp:  [0-40] 20 (12/05 1000) BP: (78-108)/(42-79) 94/42 (12/05 1000) SpO2:  [95 %-100 %] 97 % (12/05 1000)   General appearance: alert and no distress Resp: clear to auscultation bilaterally Cardio: regular rate and rhythm GI: normal findings: bowel sounds normal and soft, non-tender  Lab Results  Recent Labs  06/20/16 0335 06/21/16 0455  WBC 4.8 6.9  HGB 8.8* 9.6*  HCT 25.6* 28.5*  NA 133* 134*  K 3.4* 3.1*  CL 102 103  CO2 26 26  BUN 11 13  CREATININE 0.48 0.66   Liver Panel  Recent Labs  06/18/16 2000 06/21/16 0455  PROT 5.7*  --   ALBUMIN 2.3* 1.7*  AST 44*  --  ALT 28  --   ALKPHOS 47  --   BILITOT 0.6  --    Sedimentation Rate No results for input(s): ESRSEDRATE in the last 72 hours. C-Reactive Protein No results for input(s): CRP in the last 72 hours.  Microbiology: Recent Results (from the past 240 hour(s))  Blood Culture (routine x 2)     Status: Abnormal (Preliminary result)   Collection Time: 06/18/16  8:12 PM  Result Value Ref Range Status   Specimen Description BLOOD LEFT HAND  Final   Special Requests IN PEDIATRIC BOTTLE 4CC  Final   Culture  Setup Time   Final    GRAM POSITIVE COCCI IN CHAINS IN PEDIATRIC BOTTLE CRITICAL RESULT CALLED TO, READ BACK BY AND VERIFIED WITH: R RUMBARGER  06/19/16 @ 1050 M VESTAL    Culture GROUP A STREP (S.PYOGENES) ISOLATED (A)  Final   Report Status PENDING  Incomplete  Blood Culture ID Panel (Reflexed)     Status: Abnormal   Collection Time: 06/18/16  8:12  PM  Result Value Ref Range Status   Enterococcus species NOT DETECTED NOT DETECTED Final   Listeria monocytogenes NOT DETECTED NOT DETECTED Final   Staphylococcus species NOT DETECTED NOT DETECTED Final   Staphylococcus aureus NOT DETECTED NOT DETECTED Final   Streptococcus species DETECTED (A) NOT DETECTED Final    Comment: CRITICAL RESULT CALLED TO, READ BACK BY AND VERIFIED WITH: R RUMBARGER  06/19/16 @ 1050 M VESTAL    Streptococcus agalactiae NOT DETECTED NOT DETECTED Final   Streptococcus pneumoniae NOT DETECTED NOT DETECTED Final   Streptococcus pyogenes DETECTED (A) NOT DETECTED Final    Comment: CRITICAL RESULT CALLED TO, READ BACK BY AND VERIFIED WITH: R RUMBARGER  06/19/16 @ 1050 M VESTAL    Acinetobacter baumannii NOT DETECTED NOT DETECTED Final   Enterobacteriaceae species NOT DETECTED NOT DETECTED Final   Enterobacter cloacae complex NOT DETECTED NOT DETECTED Final   Escherichia coli NOT DETECTED NOT DETECTED Final   Klebsiella oxytoca NOT DETECTED NOT DETECTED Final   Klebsiella pneumoniae NOT DETECTED NOT DETECTED Final   Proteus species NOT DETECTED NOT DETECTED Final   Serratia marcescens NOT DETECTED NOT DETECTED Final   Haemophilus influenzae NOT DETECTED NOT DETECTED Final   Neisseria meningitidis NOT DETECTED NOT DETECTED Final   Pseudomonas aeruginosa NOT DETECTED NOT DETECTED Final   Candida albicans NOT DETECTED NOT DETECTED Final   Candida glabrata NOT DETECTED NOT DETECTED Final   Candida krusei NOT DETECTED NOT DETECTED Final   Candida parapsilosis NOT DETECTED NOT DETECTED Final   Candida tropicalis NOT DETECTED NOT DETECTED Final  Urine culture     Status: Abnormal   Collection Time: 06/18/16  8:27 PM  Result Value Ref Range Status   Specimen Description URINE, CLEAN CATCH  Final   Special Requests NONE  Final   Culture MULTIPLE SPECIES PRESENT, SUGGEST RECOLLECTION (A)  Final   Report Status 06/20/2016 FINAL  Final  Blood Culture (routine x 2)      Status: Abnormal (Preliminary result)   Collection Time: 06/18/16  8:36 PM  Result Value Ref Range Status   Specimen Description BLOOD BLOOD LEFT FOREARM  Final   Special Requests BOTTLES DRAWN AEROBIC AND ANAEROBIC 5CC  Final   Culture  Setup Time   Final    GRAM POSITIVE COCCI IN CHAINS IN BOTH AEROBIC AND ANAEROBIC BOTTLES CRITICAL VALUE NOTED.  VALUE IS CONSISTENT WITH PREVIOUSLY REPORTED AND CALLED VALUE.    Culture GROUP A STREP (S.PYOGENES) ISOLATED (  A)  Final   Report Status PENDING  Incomplete  MRSA PCR Screening     Status: None   Collection Time: 06/19/16  1:26 AM  Result Value Ref Range Status   MRSA by PCR NEGATIVE NEGATIVE Final    Comment:        The GeneXpert MRSA Assay (FDA approved for NASAL specimens only), is one component of a comprehensive MRSA colonization surveillance program. It is not intended to diagnose MRSA infection nor to guide or monitor treatment for MRSA infections.   Culture, blood (single)     Status: None (Preliminary result)   Collection Time: 06/19/16  2:59 AM  Result Value Ref Range Status   Specimen Description BLOOD LEFT HAND  Final   Special Requests IN PEDIATRIC BOTTLE 1CC  Final   Culture NO GROWTH 1 DAY  Final   Report Status PENDING  Incomplete    Studies/Results: No results found.   Assessment/Plan: Group A strep sepsis IVDA HIV - Hypokalemia, hyponatremia  Total days of antibiotics: 3 ceftriaxone  Would check TEE TTE does not mention vegetation Repeat BCx 12-3 ngtd Await HIV RNA,  Hepatitis A/B (-) Hep C positive Will check Hep C RNA, genotype.  RPR (-) Check urine gc/chlamydia         Johny SaxJeffrey Mylo Driskill Infectious Diseases (pager) [phone] [email or web address] 06/21/2016, 10:40 AM  LOS: 3 days

## 2016-06-21 NOTE — Progress Notes (Signed)
PULMONARY / CRITICAL CARE MEDICINE   Name: Megan Farmer MRN: [id number] DOB: [birth year]    ADMISSION DATE:  06/18/2016 CONSULTATION DATE:  06/18/16  REFERRING MD:  ED physicican  CHIEF COMPLAINT:  Septic shock  HISTORY OF PRESENT ILLNESS:   Megan Farmer is a 20 y/o woman with a history of IV drug use, injects heroin and smokes cocaine.  Patient presents to the ED with complaint of right elbow pain, rash, cough and congestion, fever. Pt states approximately a week ago she injected right AC with crack cocaine "wash." She explains that is when a cotton is soaked and crack cocaine and vinegar, and then this mixture is drawn and injected. Patient states in her case this was old and she just had leftovers from a couple of days that she drew up and injected. She states since then she developed pain and swelling to the right elbow. She has pain with full extension of the joint. She is also reporting cough and congestion with green productive sputum for 1-2 weeks. She states today she broke out in a rash in her extremities. She believes she developed fever at some point today as well. She has been staying in a hotel with an elderly couple that picked her up that has dogs. States rash is itchy. She was given Benadryl by EMS prior to coming in. She reports no prior medical problems. She denies taking any medications prior to coming in.  Pt remained hypotensive despite 5L IV fluids and was eventually started on levophed and admitted to the ICU.   SUBJECTIVE: Patient off Levophed since 10am yesterday. Patient underwent involuntary commitment given confusion & risk to her life. Patient reports diffuse pain and is tearful. Still not able to tell me the fact that she is bacteremic. Believes she is hospitalized for her drug use and withdrawal. Intermittent coughing. Patient reports pain is 8/10 with heart rate of 90bpm.   REVIEW OF SYSTEMS:  No fever or chills. No nausea, emesis, abdominal pain, or diarrhea.  No headache.   VITAL SIGNS: BP 91/61   Pulse 95   Temp 98.2 F (36.8 C) (Oral)   Resp (!) 24   Ht 5\' 3"  (1.6 m)   Wt 123 lb 7.3 oz (56 kg)   LMP 05/29/2016   SpO2 95%   BMI 21.87 kg/m   HEMODYNAMICS:    VENTILATOR SETTINGS:    INTAKE / OUTPUT: I/O last 3 completed shifts: In: 1755.3 [I.V.:1240.3; IV Piggyback:515] Out: 3501 [Urine:3500; Stool:1]  PHYSICAL EXAMINATION: General:  Anxious. Tearful. No acute distress. No family at bedside.  Neuro:  CN grossly in tact. No meningismus. Oriented x3 but not to situation or risk to her life.  HEENT:  Moist mucus membranes. No scleral icterus or injection. Cardiovascular:  Regular rate and rhythm. No edema. No JVD. Pulmonary:  Clear on auscultation. Normal work of breathing. Speaking in complete sentences. Abdomen:  Soft. Nontender. Normal bowel sounds.  Integument:  Warm & dry. No rash on exposed skin. Bandage around right forearm clean & dry.   LABS:  BMET  Recent Labs Lab 06/19/16 0304 06/20/16 0335 06/21/16 0455  NA 134* 133* 134*  K 3.3* 3.4* 3.1*  CL 105 102 103  CO2 22 26 26   BUN 5* 11 13  CREATININE 0.61 0.48 0.66  GLUCOSE 97 117* 79   Electrolytes  Recent Labs Lab 06/19/16 0304 06/20/16 0335 06/21/16 0455  CALCIUM 7.4* 8.0* 7.9*  MG  --  1.9 2.0  PHOS  --  3.0  3.5   CBC  Recent Labs Lab 06/19/16 0304 06/20/16 0335 06/21/16 0455  WBC 8.0 4.8 6.9  HGB 10.7* 8.8* 9.6*  HCT 32.0* 25.6* 28.5*  PLT 107* 101* 136*   Coag's No results for input(s): APTT, INR in the last 168 hours.  Sepsis Markers  Recent Labs Lab 06/18/16 2006 06/18/16 2251  LATICACIDVEN 2.22* 1.45   ABG No results for input(s): PHART, PCO2ART, PO2ART in the last 168 hours.  Liver Enzymes  Recent Labs Lab 06/18/16 2000 06/21/16 0455  AST 44*  --   ALT 28  --   ALKPHOS 47  --   BILITOT 0.6  --   ALBUMIN 2.3* 1.7*   Cardiac Enzymes No results for input(s): TROPONINI, PROBNP in the last 168  hours.  Glucose  Recent Labs Lab 06/19/16 0139 06/19/16 0747 06/21/16 0429 06/21/16 0753  GLUCAP 112* 108* 80 78   Imaging No results found.   STUDIES:  CT R Elbow W/ Contrast 12/2: IMPRESSION: 1. No evidence of fracture or dislocation. No evidence of osseous erosion. 2. Right elbow joint effusion, with mild peripheral enhancement, raising suspicion for septic joint. 3. Apparent tiny 1.2 cm intramuscular abscess within the superficial musculature at the level of the antecubital fossa. 4. Diffuse thrombophlebitis involving multiple veins at and above the antecubital fossa. 5. Mild soft tissue edema tracking along the dorsum of the forearm and elbow. TTE 12/4:  LV normal in size w/ EF 50-55%. LA & RA normal in size. RV normal in size & function. No pericardial effusion. No vegetations noted.   MICROBIOLOGY: MRSA PCR 12/3:  Negative  Blood Ctx x2 12/2: 2/2 Strep pyogenes  Urine Ctx 12/2:  Multiple species Blood Ctx x1 12/3 >>  ANTIBIOTICS: Aztreonam 12/2 - 12/3 Levaquin 12/2 - 12/3 Vancomycin 12/2 - 12/4 Clindamycin 12/2 - 12/4 Rocephin 12/4 >>  SIGNIFICANT EVENTS: 12/3 - to the OR for I&D right elbow/arm  LINES/TUBES: L Subclavian TLC 12/3 >> PIV x2  ASSESSMENT / PLAN:  CARDIOVASCULAR A:  Shock - Likely sepsis. Sinus Tachycardia - Multifactorial.   P:  Continuous telemetry monitoring Vitals per unit protocol Weaning Levophed to maintain MAP >65  INFECTIOUS A:   Sepsis Streptococcus pyogenes Bacteremia Possible R Elbow Septic Joint Hepatitis C Virus Infection - Ab Positive 12/4.   P:   Rocephin per ID recommendations & appreciate assistance  Awaiting culture finalization Repeat Blood Cultures Today  Patient refusingTrans-esophageal Echocardiogram   MUSCULOSKELETAL A: Septic Right Elbow - S/P I & D 12/3.  P: Dr. Rayburn MaBlackmon has seen Plan for Sutures for 2 weeks  PULMONARY A: No acute issues.  P:   Continuous pulse  oximetry  GASTROINTESTINAL A:   Nausea w/ Emesis - Likely due to withdrawal  P:   Heart Health Diet Zofran IV prn   HEMATOLOGIC A:   Anemia - Mild & Hgb stable. No signs of active bleeding. Thrombocytopenia - Likely splenic sequestration with sepsis. Resolved.   P:  Trending cell counts daily w/ CBC Lovenox [place] daily SCDs  RENAL A: Hypokalemia - Replaced. Hyponatremia - Mild.   P: Monitoring UOP Trending renal function & electrolytes daily Replacing electrolytes as indicated KCl 60 mEq IV  NEUROLOGICAL A: Acute Drug Withdrawal IV Drug Use - Cocaine w/ vinegar.   P: Fentanyl IV prn Ativan IV prn  Involuntary Commitment Flight & Fall Risk notifications placed Safety Sitter at Bedside   ENDOCRINE  A: No acute issues.  P: Monitor glucose on daily labs.   FAMILY  -  Updates: Patient updated bedside 12/5 by Dr. Jamison Neighbor.   - Inter-disciplinary family meet or Palliative Care meeting due by:  06/25/16  TODAY'S SUMMARY:  20 y.o. female with IVDU with septic shock. Shock has resolved. Sepsis due to bacteremia improving. Patient refusing TEE. Involuntarily committed. Transferring to med-surg bed for further care.  TRH to assume care & PCCM off as of 12/6.   Donna Christen Jamison Neighbor, M.D. [facility] Pager:  [phone] After 3pm or if no response, call [phone] 06/21/2016, 9:21 AM

## 2016-06-21 NOTE — Progress Notes (Signed)
Patient ID: Megan Farmer, female   DOB: 06/24/1996, 20 y.o.   MRN: [id number] Right arm stable.  Incision looks good.  No cellulitis and compartments soft.  Bends elbow and flexes/extends fingers more easily.  No further recs from ortho stand point.  Dry dressings as needed.  Sutures to stay in 2 weeks.

## 2016-06-21 NOTE — Progress Notes (Addendum)
CSW attempted to engage with Patient at her bedside. Patient appears very drowsy and disoriented. Patient reports being "dope sick". She reports using crack and heroin for five years. She denies having any children. She is unclear about where her family is located and does not have their contact information. Patient unable to tell her living arrangements prior to admission and made mention of two [place] she lived [place]. Her records indicate she was living with an elderly couple in a hotel. Patient also made mention of having two brothers in [place] who are in and out of trouble. CSW attempted to locate family and has been unsuccessful. Patient unable to provide social security number at this time. CSW continues to follow.      Enos FlingAshley Jhamari Markowicz, MSW, LCSW [facility] Clinical Social Worker [phone]

## 2016-06-21 NOTE — Progress Notes (Signed)
[facility] LAB REPLACEMENT ONLY  The patient does [facility] based on the criteria listed below:   1. Is GFR >/= 40 ml/min? Yes.    Patient's GFR today is >60 2. Is urine output >/= 0.5 ml/kg/hr for the last 6 hours? Yes.   Patient's UOP is 1.0 ml/kg/hr 3. Is BUN < 60 mg/dL? Yes.    Patient's BUN today is 13 4. Abnormal electrolyte(s): K 3.1 5. Ordered repletion with: protocol 6. If Farmer panic level lab has been reported, has the CCM MD in charge been notified? No..   Physician:    Megan Farmer, Megan Farmer 06/21/2016 5:56 AM

## 2016-06-21 NOTE — Discharge Instructions (Signed)
You can get your right arm incision wet daily in the shower. New dry dressing as needed to protect sutures. Increase your activities with that arm as comfort allows. Elevation and ice as needed for swelling. Sutures to stay in for 2 weeks.

## 2016-06-21 NOTE — Progress Notes (Addendum)
Report received from Benay PillowPenny S. RN and patient brought to [facility]. Patient has a sitter at the bedside. Patient's shirt and pants placed in bag with patient label and put in utility room. Patient also came with a bottle of childrens liquid tylenol which was sent to pharmacy. Patient oriented to room and call bell. Vital signs obtained. Will continue to monitor. Megan CarboAubrey  Miliyah Farmer

## 2016-06-22 DIAGNOSIS — B950 Streptococcus, group A, as the cause of diseases classified elsewhere: Secondary | ICD-10-CM

## 2016-06-22 DIAGNOSIS — A400 Sepsis due to streptococcus, group A: Secondary | ICD-10-CM

## 2016-06-22 DIAGNOSIS — E876 Hypokalemia: Secondary | ICD-10-CM

## 2016-06-22 DIAGNOSIS — F17210 Nicotine dependence, cigarettes, uncomplicated: Secondary | ICD-10-CM

## 2016-06-22 DIAGNOSIS — Z79899 Other long term (current) drug therapy: Secondary | ICD-10-CM

## 2016-06-22 LAB — CBC WITH DIFFERENTIAL/PLATELET
BASOS ABS: 0 10*3/uL (ref 0.0–0.1)
Basophils Relative: 0 %
Eosinophils Absolute: 0.1 10*3/uL (ref 0.0–0.7)
Eosinophils Relative: 1 %
HCT: 31.4 % — ABNORMAL LOW (ref 36.0–46.0)
Hemoglobin: 10.3 g/dL — ABNORMAL LOW (ref 12.0–15.0)
LYMPHS ABS: 2.6 10*3/uL (ref 0.7–4.0)
LYMPHS PCT: 39 %
MCH: 26 pg (ref 26.0–34.0)
MCHC: 32.8 g/dL (ref 30.0–36.0)
MCV: 79.3 fL (ref 78.0–100.0)
MONOS PCT: 8 %
Monocytes Absolute: 0.5 10*3/uL (ref 0.1–1.0)
NEUTROS PCT: 52 %
Neutro Abs: 3.4 10*3/uL (ref 1.7–7.7)
PLATELETS: 181 10*3/uL (ref 150–400)
RBC: 3.96 MIL/uL (ref 3.87–5.11)
RDW: 14 % (ref 11.5–15.5)
WBC: 6.6 10*3/uL (ref 4.0–10.5)

## 2016-06-22 LAB — RENAL FUNCTION PANEL
ALBUMIN: 2.3 g/dL — AB (ref 3.5–5.0)
ANION GAP: 9 (ref 5–15)
BUN: 7 mg/dL (ref 6–20)
CO2: 25 mmol/L (ref 22–32)
Calcium: 8.4 mg/dL — ABNORMAL LOW (ref 8.9–10.3)
Chloride: 102 mmol/L (ref 101–111)
Creatinine, Ser: 0.57 mg/dL (ref 0.44–1.00)
Glucose, Bld: 82 mg/dL (ref 65–99)
PHOSPHORUS: 4.4 mg/dL (ref 2.5–4.6)
POTASSIUM: 3.6 mmol/L (ref 3.5–5.1)
Sodium: 136 mmol/L (ref 135–145)

## 2016-06-22 LAB — MAGNESIUM: MAGNESIUM: 1.9 mg/dL (ref 1.7–2.4)

## 2016-06-22 MED ORDER — DICYCLOMINE HCL 20 MG PO TABS: 20.0000 mg | ORAL_TABLET | Freq: Four times a day (QID) | ORAL | Status: DC | PRN

## 2016-06-22 MED ORDER — METHOCARBAMOL 500 MG PO TABS
500.0000 mg | ORAL_TABLET | Freq: Three times a day (TID) | ORAL | Status: DC | PRN
  Administered 2016-06-22: 500 mg via ORAL
  Filled 2016-06-22: qty 1

## 2016-06-22 MED ORDER — CLONIDINE HCL 0.1 MG PO TABS: 0.1000 mg | ORAL_TABLET | ORAL | Status: DC

## 2016-06-22 MED ORDER — ONDANSETRON 4 MG PO TBDP: 4.0000 mg | ORAL_TABLET | Freq: Four times a day (QID) | ORAL | Status: DC | PRN

## 2016-06-22 MED ORDER — CLONIDINE HCL 0.1 MG PO TABS
0.1000 mg | ORAL_TABLET | Freq: Four times a day (QID) | ORAL | Status: DC
  Administered 2016-06-22: 0.1 mg via ORAL
  Filled 2016-06-22 (×2): qty 1

## 2016-06-22 MED ORDER — HYDROXYZINE HCL 25 MG PO TABS
25.0000 mg | ORAL_TABLET | Freq: Four times a day (QID) | ORAL | Status: DC | PRN
  Administered 2016-06-22: 25 mg via ORAL
  Filled 2016-06-22: qty 1

## 2016-06-22 MED ORDER — LOPERAMIDE HCL 2 MG PO CAPS: 2.0000 mg | ORAL_CAPSULE | ORAL | Status: DC | PRN

## 2016-06-22 MED ORDER — CLONIDINE HCL 0.1 MG PO TABS: 0.1000 mg | ORAL_TABLET | Freq: Every day | ORAL | Status: DC

## 2016-06-22 MED ORDER — NAPROXEN 250 MG PO TABS: 500.0000 mg | ORAL_TABLET | Freq: Two times a day (BID) | ORAL | Status: DC | PRN

## 2016-06-22 NOTE — Progress Notes (Signed)
    [facility] has been requested to perform a transesophageal echocardiogram on Megan Farmer for 06/24/16 at 12 noon.  After careful review of history and examination, the risks and benefits of transesophageal echocardiogram have been explained including risks of esophageal damage, perforation (1:10,000 risk), bleeding, pharyngeal hematoma as well as other potential complications associated with conscious sedation including aspiration, arrhythmia, respiratory failure and death. Alternatives to treatment were discussed, questions were answered. Patient is willing to proceed.   Cline CrockKathryn Atzin Buchta, PA-C  06/22/2016 10:47 AM

## 2016-06-22 NOTE — Consult Note (Signed)
[facility] Face-to-Face Psychiatry Consult   Reason for Consult:  Capacity evaluation, sepsis and IVDU Referring Physician:  Dr. Carles Collet Patient Identification: Megan Farmer MRN:  [id number] Principal Diagnosis: Sepsis associated hypotension [facility]) Diagnosis:   Patient Active Problem List   Diagnosis Date Noted  . Sepsis due to Streptococcus, group A ([place]) [A40.0] 06/22/2016  . Hypokalemia [E87.6]   . Joint infection ([place]) [M00.9]   . Group A streptococcal infection [B95.0]   . Septic shock ([place]) [A41.9, R65.21] 06/19/2016  . Cellulitis of right arm [L03.113]   . Sepsis associated hypotension ([place]) [A41.9] 06/18/2016  . IVDU (intravenous drug user) [F19.90] 06/18/2016    Total Time spent with patient: 45 minutes  Subjective:   Megan Farmer is a 20 y.o. female patient admitted with sepsis.  HPI:  [name] is a 20 y.o. female with medical history significant of IVDU, injects heroin and smokes cocaine. Patient seen, chart reviewed and case briefly discussed with the case manager on the floor. Patient was initially sleeping and could not wake her up with verbal and tactile stimulus but later she was able to wake up and able to contribute for this evaluation. Patient reported her family is from [facility] and living in [place]. Patient lives with her mom, dad, nephew, niece who are younger than 53 years old. Patient reported she was graduated from high school at [employer], since then she has been hanging out with the wrong crowd and has been abusing opioids and cocaine. Patient endorses IV drug use for the last 3 years. Patient reported she was received Suboxone clinic therapy from [facility] 44 and reportedly not using properly or appropriately but continued to use IV heroin. Patient reported she has been physically sick with the sepsis and reported she does not want use anymore and willing to receive detox treatment for opioids and also willing to follow up with the  methadone clinic upon discharge from the hospital. Patient denies depression, anxiety, bipolar mania, psychosis and denies suicidal and homicidal ideation, intention or plans. She has no family members at bedside or collateral information.  Medical history: Patient presents to the ED with complaint of right elbow pain, rash, cough and congestion, fever. Pt states approximately a week ago she injected right AC with crack cocaine "wash." She explains that is when a cotton is soaked and crack cocaine and vinegar, and then this mixture is drawn and injected. Patient states in her case this was old and she just had leftovers from a couple of days that she drew up and injected. She states since then she developed pain and swelling to the right elbow. She has pain with full extension of the joint. She is also reporting cough and congestion with green productive sputum for 1-2 weeks. She states today she broke out in a rash in her extremities. She believes she developed fever at some point today as well. She has been staying in a hotel with an elderly couple that picked her up that has dogs. States rash is itchy. She was given Benadryl by EMS prior to coming in. She reports no prior medical problems. She denies taking any medications prior to coming in.  Past Psychiatric History: IVDU, denied a history of acute psychiatric hospitalization.  Risk to Self:   Risk to Others:   Prior Inpatient Therapy:   Prior Outpatient Therapy:    Past Medical History: History reviewed. No pertinent past medical history.  Past Surgical History:  Procedure Laterality Date  . I&D EXTREMITY Right  06/19/2016   Procedure: IRRIGATION AND DEBRIDEMENT EXTREMITY;  Surgeon: Mcarthur Rossetti, MD;  Location: [facility];  Service: Orthopedics;  Laterality: Right;   Family History: History reviewed. No pertinent family history. Family Psychiatric  History: Denied family history of mental illness and substance abuse. Social History:   History  Alcohol Use No     History  Drug Use  . Types: IV, Cocaine    Comment: heroin- last used 06-15-16, last smoked crack 06-13-16     Social History   Social History  . Marital status: Single    Spouse name: N/A  . Number of children: N/A  . Years of education: N/A   Social History Main Topics  . Smoking status: Current Every Day Smoker  . Smokeless tobacco: Never Used     Comment: Doesn't have money to buy, will smoke when someone gives them to her.   . Alcohol use No  . Drug use:     Types: IV, Cocaine     Comment: heroin- last used 06-15-16, last smoked crack 06-13-16   . Sexual activity: No   Other Topics Concern  . None   Social History Narrative  . None   Additional Social History:    Allergies:   Allergies  Allergen Reactions  . Amoxicillin Swelling  . Penicillins Swelling    Labs:  Results for orders placed or performed during the hospital encounter of 06/18/16 (from the past 48 hour(s))  Hepatitis panel, acute     Status: Abnormal   Collection Time: 06/20/16  5:11 PM  Result Value Ref Range   Hepatitis B Surface Ag Negative Negative   HCV Ab >11.0 (H) 0.0 - 0.9 s/co ratio    Comment: (NOTE)                                  Negative:     < 0.8                             Indeterminate: 0.8 - 0.9                                  Positive:     > 0.9 The CDC recommends that a positive HCV antibody result be followed up with a HCV Nucleic Acid Amplification test (295284). Performed At: [facility] [place], [place] [id number] Lindon Romp MD VO:[id number]    Hep A IgM Negative Negative   Hep B C IgM Negative Negative  HIV 1 RNA quant-no reflex-bld     Status: None   Collection Time: 06/20/16  5:11 PM  Result Value Ref Range   HIV 1 RNA Quant <20 copies/mL    Comment: (NOTE) HIV-1 RNA not detected The reportable range for this assay is 20 to 10,000,000 copies HIV-1 RNA/mL. Performed At: [facility] [place], [place] [id number] Lindon Romp MD FI:[id number]    LOG10 HIV-1 RNA UNABLE TO CALCULATE log10copy/mL    Comment: (NOTE) Unable to calculate result since non-numeric result obtained for component test.   RPR     Status: None   Collection Time: 06/20/16  5:11 PM  Result Value Ref Range   RPR Ser Ql Non Reactive Non Reactive    Comment: (NOTE) Performed At: [facility]  [place], [place] [id number] Lindon Romp MD CB:[id number]   Glucose, capillary     Status: None   Collection Time: 06/21/16  4:29 AM  Result Value Ref Range   Glucose-Capillary 80 65 - 99 mg/dL   Comment 1 Notify RN   Magnesium     Status: None   Collection Time: 06/21/16  4:55 AM  Result Value Ref Range   Magnesium 2.0 1.7 - 2.4 mg/dL  CBC with Differential/Platelet     Status: Abnormal   Collection Time: 06/21/16  4:55 AM  Result Value Ref Range   WBC 6.9 4.0 - 10.5 K/uL   RBC 3.65 (L) 3.87 - 5.11 MIL/uL   Hemoglobin 9.6 (L) 12.0 - 15.0 g/dL   HCT 28.5 (L) 36.0 - 46.0 %   MCV 78.1 78.0 - 100.0 fL   MCH 26.3 26.0 - 34.0 pg   MCHC 33.7 30.0 - 36.0 g/dL   RDW 13.8 11.5 - 15.5 %   Platelets 136 (L) 150 - 400 K/uL   Neutrophils Relative % 56 %   Lymphocytes Relative 36 %   Monocytes Relative 7 %   Eosinophils Relative 1 %   Basophils Relative 0 %   Neutro Abs 3.8 1.7 - 7.7 K/uL   Lymphs Abs 2.5 0.7 - 4.0 K/uL   Monocytes Absolute 0.5 0.1 - 1.0 K/uL   Eosinophils Absolute 0.1 0.0 - 0.7 K/uL   Basophils Absolute 0.0 0.0 - 0.1 K/uL   WBC Morphology ATYPICAL LYMPHOCYTES   Renal function panel     Status: Abnormal   Collection Time: 06/21/16  4:55 AM  Result Value Ref Range   Sodium 134 (L) 135 - 145 mmol/L   Potassium 3.1 (L) 3.5 - 5.1 mmol/L   Chloride 103 101 - 111 mmol/L   CO2 26 22 - 32 mmol/L   Glucose, Bld 79 65 - 99 mg/dL   BUN 13 6 - 20 mg/dL   Creatinine, Ser 0.66 0.44 - 1.00 mg/dL   Calcium 7.9 (L) 8.9 - 10.3 mg/dL    Phosphorus 3.5 2.5 - 4.6 mg/dL   Albumin 1.7 (L) 3.5 - 5.0 g/dL   GFR calc non Af Amer >60 >60 mL/min   GFR calc Af Amer >60 >60 mL/min    Comment: (NOTE) The eGFR has been calculated using the CKD EPI equation. This calculation has not been validated in all clinical situations. eGFR's persistently <60 mL/min signify possible Chronic Kidney Disease.    Anion gap 5 5 - 15  Glucose, capillary     Status: None   Collection Time: 06/21/16  7:53 AM  Result Value Ref Range   Glucose-Capillary 78 65 - 99 mg/dL   Comment 1 Notify RN    Comment 2 Document in Chart   Renal function panel     Status: Abnormal   Collection Time: 06/22/16  5:30 AM  Result Value Ref Range   Sodium 136 135 - 145 mmol/L   Potassium 3.6 3.5 - 5.1 mmol/L   Chloride 102 101 - 111 mmol/L   CO2 25 22 - 32 mmol/L   Glucose, Bld 82 65 - 99 mg/dL   BUN 7 6 - 20 mg/dL   Creatinine, Ser 0.57 0.44 - 1.00 mg/dL   Calcium 8.4 (L) 8.9 - 10.3 mg/dL   Phosphorus 4.4 2.5 - 4.6 mg/dL   Albumin 2.3 (L) 3.5 - 5.0 g/dL   GFR calc non Af Amer >60 >60 mL/min   GFR calc Af Amer >60 >  60 mL/min    Comment: (NOTE) The eGFR has been calculated using the CKD EPI equation. This calculation has not been validated in all clinical situations. eGFR's persistently <60 mL/min signify possible Chronic Kidney Disease.    Anion gap 9 5 - 15  CBC with Differential/Platelet     Status: Abnormal   Collection Time: 06/22/16  5:30 AM  Result Value Ref Range   WBC 6.6 4.0 - 10.5 K/uL   RBC 3.96 3.87 - 5.11 MIL/uL   Hemoglobin 10.3 (L) 12.0 - 15.0 g/dL   HCT 31.4 (L) 36.0 - 46.0 %   MCV 79.3 78.0 - 100.0 fL   MCH 26.0 26.0 - 34.0 pg   MCHC 32.8 30.0 - 36.0 g/dL   RDW 14.0 11.5 - 15.5 %   Platelets 181 150 - 400 K/uL   Neutrophils Relative % 52 %   Lymphocytes Relative 39 %   Monocytes Relative 8 %   Eosinophils Relative 1 %   Basophils Relative 0 %   Neutro Abs 3.4 1.7 - 7.7 K/uL   Lymphs Abs 2.6 0.7 - 4.0 K/uL   Monocytes Absolute 0.5  0.1 - 1.0 K/uL   Eosinophils Absolute 0.1 0.0 - 0.7 K/uL   Basophils Absolute 0.0 0.0 - 0.1 K/uL   WBC Morphology ATYPICAL LYMPHOCYTES   Magnesium     Status: None   Collection Time: 06/22/16  5:30 AM  Result Value Ref Range   Magnesium 1.9 1.7 - 2.4 mg/dL    Current Facility-Administered Medications  Medication Dose Route Frequency Provider Last Rate Last Dose  . cefTRIAXone (ROCEPHIN) 2 g in dextrose 5 % 50 mL IVPB  2 g Intravenous Q24H Campbell Riches, MD   2 g at 06/21/16 1629  . enoxaparin (LOVENOX) injection 40 mg  40 mg Subcutaneous Q24H Etta Quill, DO   40 mg at 06/21/16 1018  . fentaNYL (SUBLIMAZE) injection 25-50 mcg  25-50 mcg Intravenous Q2H PRN Javier Glazier, MD   50 mcg at 06/22/16 1028  . fentaNYL (SUBLIMAZE) injection 50 mcg  50 mcg Intravenous Once Guy Begin, MD      . LORazepam (ATIVAN) injection 2 mg  2 mg Intravenous Q6H PRN Mauri Brooklyn, MD   2 mg at 06/22/16 0644  . midazolam (VERSED) injection 2 mg  2 mg Intravenous Once Guy Begin, MD      . ondansetron [facility]) injection 4 mg  4 mg Intravenous Q6H PRN Javier Glazier, MD      . sodium chloride flush (NS) 0.9 % injection 10-40 mL  10-40 mL Intracatheter Q12H Javier Glazier, MD   10 mL at 06/21/16 1617  . sodium chloride flush (NS) 0.9 % injection 10-40 mL  10-40 mL Intracatheter PRN Javier Glazier, MD   10 mL at 06/22/16 0542  . sodium chloride flush (NS) 0.9 % injection 3 mL  3 mL Intravenous Q12H Etta Quill, DO   3 mL at 06/20/16 2200    Musculoskeletal: Strength & Muscle Tone: decreased Gait & Station: unable to stand Patient leans: N/A  Psychiatric Specialty Exam: Physical Exam as per history and physical   ROS endorses IV drug abuse for the last 3 years and recent sepsis of her hand and seeking for detox treatment and rehabilitation upon discharge. Patient endorses nausea, vomiting, stomach upset, diarrhea, sweating as a withdrawal symptoms.  No Fever-chills, No  Headache, No changes with Vision or hearing, reports vertigo No problems swallowing food or Liquids,  No Chest pain, Cough or Shortness of Breath, No Abdominal pain, No Nausea or Vommitting, Bowel movements are regular, No Blood in stool or Urine, No dysuria, No new skin rashes or bruises, No new joints pains-aches,  No new weakness, tingling, numbness in any extremity, No recent weight gain or loss, No polyuria, polydypsia or polyphagia,   A full 10 point Review of Systems was done, except as stated above, all other Review of Systems were negative.  Blood pressure (!) 83/47, pulse 86, temperature 97.9 F (36.6 C), temperature source Oral, resp. rate 18, height '5\' 3"'  (1.6 m), weight 56 kg (123 lb 7.3 oz), last menstrual period 05/29/2016, SpO2 100 %.Body mass index is 21.87 kg/m.  General Appearance: Casual  Eye Contact:  Good  Speech:  Clear and Coherent  Volume:  Decreased  Mood:  Anxious and Depressed  Affect:  Appropriate and Congruent  Thought Process:  Coherent and Goal Directed  Orientation:  Full (Time, Place, and Person)  Thought Content:  WDL  Suicidal Thoughts:  No  Homicidal Thoughts:  No  Memory:  Immediate;   Good Recent;   Good Remote;   Good  Judgement:  Impaired  Insight:  Fair  Psychomotor Activity:  Decreased  Concentration:  Concentration: Fair and Attention Span: Fair  Recall:  Good  Fund of Knowledge:  Good  Language:  Good  Akathisia:  Negative  Handed:  Right  AIMS (if indicated):     Assets:  Communication Skills Desire for Improvement Financial Resources/Insurance Housing Leisure Time Physical Health Resilience Social Support Transportation  ADL's:  Intact  Cognition:  WNL  Sleep:        Treatment Plan Summary: 20 years old female with a 3 years history of polysubstance abuse including heroin IV drug abuse and cocaine IV drug abuse and failed Suboxone clinic due to noncompliance are partially compliant with the therapy. Patient  presented with sepsis of left arm secondary to IV drug use. Patient endorses symptoms of opioid withdrawal during my evaluation and seeking for treatment and also willing to participate in rehabilitation upon discharge.   Patient has intact cognition during my evaluation and she meets criteria per Ostomy: Medical decisions and living arrangements. Dr. Carles Collet agree to start Clonidine detox protocol for opioid detox as of today Patient may benefit from clonidine detox protocol and reportedly fentanyl is not helping her  Daily contact with patient to assess and evaluate symptoms and progress in treatment and Medication management  Appreciate psychiatric consultation and follow up as clinically required Please contact [phone] or [phone] if needs further assistance  Disposition: No evidence of imminent risk to self or others at present.   Patient does not meet criteria for psychiatric inpatient admission. Supportive therapy provided about ongoing stressors.  Ambrose Finland, MD 06/22/2016 11:39 AM

## 2016-06-22 NOTE — Progress Notes (Addendum)
PROGRESS NOTE  Megan Farmer ZOX:[id number] DOB: 03/18/1996 DOA: 06/18/2016 PCP: No primary care provider on file.  Brief History:  20 y/o woman with a history of IV drug use, injects heroin and smokes cocaine. Patient presents to the ED with complaint of right elbow pain, rash, cough and congestion, fever. Pt states approximately a week ago she injected right AC with crack cocaine "wash." She explains that is when a cotton is soaked and crack cocaine and vinegar, and then this mixture is drawn and injected. Patient states in her case this was old and she just had leftovers from a couple of days that she drew up and injected. She states since then she developed pain and swelling to the right elbow. She had pain with full extension of the joint. She is also reporting cough and congestion with green productive sputum for 1-2 weeks. She states on the day of admission, she broke out in a rash in her extremities. She believes she developed fever at some point on the day of admission. She has been staying in a hotel with an elderly couple that picked her up that has dogs. States rash is pruritic. She was given Benadryl by EMS prior to coming in.   Pt remained hypotensive despite 5L IV fluids and was eventually started on levophed and admitted to the ICU.   Assessment/Plan: Sepsis -Secondary to Streptococcus biogenous bacteremia and septic right elbow -Continue ceftriaxone -Patient is afebrile, but blood pressures remained soft -pt initially confused and IVC'ed initially -psychiatry for capacity evaluation  Streptococcus by allergies bacteremia -Continue ceftriaxone D#3 -appreciate ID follow up -TEE requested--likely 12/7 or 12/8 -surveillance cultures NGTD  Septic right elbow -Status post I&D 06/19/2016, Dr. Doneen Poisson  Polysubstance abuse/IVDU -Including cocaine and heroin  Hep C antibody positive -HCV RNA and genotype pending -HIV neg -Hep A/B negative -RPR  neg -GC/Chlamydia neg   Disposition Plan:   Presently IVCed Family Communication:   No Family at bedside--Total time spent 35 minutes.  Greater than 50% spent face to face counseling and coordinating care.   Consultants:  ID  Code Status:  FULL   DVT Prophylaxis:  [place] Lovenox   Procedures: As Listed in Progress Note Above  Antibiotics: None    Subjective: Patient denies fevers, chills, headache, chest pain, dyspnea, nausea, vomiting, diarrhea, abdominal pain, dysuria, hematuria, hematochezia, and melena.   Objective: Vitals:   06/21/16 1642 06/21/16 2130 06/21/16 2351 06/22/16 0545  BP: 99/65 (!) 90/51  (!) 83/47  Pulse: 92 (!) 119 98 86  Resp: (!) 25 18  18   Temp: 98.1 F (36.7 C) 97.9 F (36.6 C)  97.9 F (36.6 C)  TempSrc: Oral Oral  Oral  SpO2: 100% 92%  100%  Weight:      Height:        Intake/Output Summary (Last 24 hours) at 06/22/16 0855 Last data filed at 06/22/16 0542  Gross per 24 hour  Intake              330 ml  Output                0 ml  Net              330 ml   Weight change:  Exam:   General:  Pt is alert, follows commands appropriately, not in acute distress  HEENT: No icterus, No thrush, No neck mass, [place]/AT  Cardiovascular: RRR, S1/S2, no rubs, no gallops  Respiratory: CTA bilaterally, no wheezing, no crackles, no rhonchi  Abdomen: Soft/+BS, non tender, non distended, no guarding  Extremities: No edema, No lymphangitis, No petechiae, No rashes, no synovitis   Data Reviewed: I have personally reviewed following labs and imaging studies Basic Metabolic Panel:  Recent Labs Lab 06/18/16 2000 06/19/16 0304 06/20/16 0335 06/21/16 0455 06/22/16 0530  NA 125* 134* 133* 134* 136  K 2.3* 3.3* 3.4* 3.1* 3.6  CL 88* 105 102 103 102  CO2 26 22 26 26 25   GLUCOSE 112* 97 117* 79 82  BUN 5* 5* 11 13 7   CREATININE 0.83 0.61 0.48 0.66 0.57  CALCIUM 7.7* 7.4* 8.0* 7.9* 8.4*  MG  --   --  1.9 2.0 1.9  PHOS  --   --  3.0 3.5 4.4    Liver Function Tests:  Recent Labs Lab 06/18/16 2000 06/21/16 0455 06/22/16 0530  AST 44*  --   --   ALT 28  --   --   ALKPHOS 47  --   --   BILITOT 0.6  --   --   PROT 5.7*  --   --   ALBUMIN 2.3* 1.7* 2.3*   No results for input(s): LIPASE, AMYLASE in the last 168 hours. No results for input(s): AMMONIA in the last 168 hours. Coagulation Profile: No results for input(s): INR, PROTIME in the last 168 hours. CBC:  Recent Labs Lab 06/18/16 2000 06/19/16 0304 06/20/16 0335 06/21/16 0455 06/22/16 0530  WBC 9.0 8.0 4.8 6.9 6.6  NEUTROABS 7.8*  --   --  3.8 3.4  HGB 11.1* 10.7* 8.8* 9.6* 10.3*  HCT 32.3* 32.0* 25.6* 28.5* 31.4*  MCV 77.8* 77.9* 77.1* 78.1 79.3  PLT 141* 107* 101* 136* 181   Cardiac Enzymes: No results for input(s): CKTOTAL, CKMB, CKMBINDEX, TROPONINI in the last 168 hours. BNP: Invalid input(s): POCBNP CBG:  Recent Labs Lab 06/19/16 0139 06/19/16 0747 06/21/16 0429 06/21/16 0753  GLUCAP 112* 108* 80 78   HbA1C: No results for input(s): HGBA1C in the last 72 hours. Urine analysis:    Component Value Date/Time   COLORURINE YELLOW 06/18/2016 2001   APPEARANCEUR CLOUDY (A) 06/18/2016 2001   LABSPEC 1.019 06/18/2016 2001   PHURINE 6.0 06/18/2016 2001   GLUCOSEU NEGATIVE 06/18/2016 2001   HGBUR NEGATIVE 06/18/2016 2001   BILIRUBINUR NEGATIVE 06/18/2016 2001   KETONESUR NEGATIVE 06/18/2016 2001   PROTEINUR 100 (A) 06/18/2016 2001   NITRITE NEGATIVE 06/18/2016 2001   LEUKOCYTESUR SMALL (A) 06/18/2016 2001   Sepsis Labs: @LABRCNTIP (procalcitonin:4,lacticidven:4) ) Recent Results (from the past 240 hour(s))  Blood Culture (routine x 2)     Status: Abnormal   Collection Time: 06/18/16  8:12 PM  Result Value Ref Range Status   Specimen Description BLOOD LEFT HAND  Final   Special Requests IN PEDIATRIC BOTTLE 4CC  Final   Culture  Setup Time   Final    GRAM POSITIVE COCCI IN CHAINS IN PEDIATRIC BOTTLE CRITICAL RESULT CALLED TO, READ BACK  BY AND VERIFIED WITH: R RUMBARGER  06/19/16 @ 1050 M VESTAL    Culture (A)  Final    GROUP A STREP (S.PYOGENES) ISOLATED HEALTH DEPARTMENT NOTIFIED    Report Status 06/21/2016 FINAL  Final   Organism ID, Bacteria GROUP A STREP (S.PYOGENES) ISOLATED  Final      Susceptibility   Group a strep (s.pyogenes) isolated - MIC*    ERYTHROMYCIN 4 RESISTANT Resistant     TETRACYCLINE >=16 RESISTANT Resistant  VANCOMYCIN <=0.12 SENSITIVE Sensitive     CLINDAMYCIN <=0.25 RESISTANT Resistant     Inducible Clindamycin POSITIVE Resistant     PENICILLIN <=0.06 SENSITIVE Sensitive     * GROUP A STREP (S.PYOGENES) ISOLATED  Blood Culture ID Panel (Reflexed)     Status: Abnormal   Collection Time: 06/18/16  8:12 PM  Result Value Ref Range Status   Enterococcus species NOT DETECTED NOT DETECTED Final   Listeria monocytogenes NOT DETECTED NOT DETECTED Final   Staphylococcus species NOT DETECTED NOT DETECTED Final   Staphylococcus aureus NOT DETECTED NOT DETECTED Final   Streptococcus species DETECTED (A) NOT DETECTED Final    Comment: CRITICAL RESULT CALLED TO, READ BACK BY AND VERIFIED WITH: R RUMBARGER  06/19/16 @ 1050 M VESTAL    Streptococcus agalactiae NOT DETECTED NOT DETECTED Final   Streptococcus pneumoniae NOT DETECTED NOT DETECTED Final   Streptococcus pyogenes DETECTED (A) NOT DETECTED Final    Comment: CRITICAL RESULT CALLED TO, READ BACK BY AND VERIFIED WITH: R RUMBARGER  06/19/16 @ 1050 M VESTAL    Acinetobacter baumannii NOT DETECTED NOT DETECTED Final   Enterobacteriaceae species NOT DETECTED NOT DETECTED Final   Enterobacter cloacae complex NOT DETECTED NOT DETECTED Final   Escherichia coli NOT DETECTED NOT DETECTED Final   Klebsiella oxytoca NOT DETECTED NOT DETECTED Final   Klebsiella pneumoniae NOT DETECTED NOT DETECTED Final   Proteus species NOT DETECTED NOT DETECTED Final   Serratia marcescens NOT DETECTED NOT DETECTED Final   Haemophilus influenzae NOT DETECTED NOT  DETECTED Final   Neisseria meningitidis NOT DETECTED NOT DETECTED Final   Pseudomonas aeruginosa NOT DETECTED NOT DETECTED Final   Candida albicans NOT DETECTED NOT DETECTED Final   Candida glabrata NOT DETECTED NOT DETECTED Final   Candida krusei NOT DETECTED NOT DETECTED Final   Candida parapsilosis NOT DETECTED NOT DETECTED Final   Candida tropicalis NOT DETECTED NOT DETECTED Final  Urine culture     Status: Abnormal   Collection Time: 06/18/16  8:27 PM  Result Value Ref Range Status   Specimen Description URINE, CLEAN CATCH  Final   Special Requests NONE  Final   Culture MULTIPLE SPECIES PRESENT, SUGGEST RECOLLECTION (A)  Final   Report Status 06/20/2016 FINAL  Final  Blood Culture (routine x 2)     Status: Abnormal   Collection Time: 06/18/16  8:36 PM  Result Value Ref Range Status   Specimen Description BLOOD BLOOD LEFT FOREARM  Final   Special Requests BOTTLES DRAWN AEROBIC AND ANAEROBIC 5CC  Final   Culture  Setup Time   Final    GRAM POSITIVE COCCI IN CHAINS IN BOTH AEROBIC AND ANAEROBIC BOTTLES CRITICAL VALUE NOTED.  VALUE IS CONSISTENT WITH PREVIOUSLY REPORTED AND CALLED VALUE.    Culture (A)  Final    GROUP A STREP (S.PYOGENES) ISOLATED SUSCEPTIBILITIES PERFORMED ON PREVIOUS CULTURE WITHIN THE LAST 5 DAYS. HEALTH DEPARTMENT NOTIFIED    Report Status 06/21/2016 FINAL  Final  MRSA PCR Screening     Status: None   Collection Time: 06/19/16  1:26 AM  Result Value Ref Range Status   MRSA by PCR NEGATIVE NEGATIVE Final    Comment:        The GeneXpert MRSA Assay (FDA approved for NASAL specimens only), is one component of a comprehensive MRSA colonization surveillance program. It is not intended to diagnose MRSA infection nor to guide or monitor treatment for MRSA infections.   Culture, blood (single)     Status: None (Preliminary result)  Collection Time: 06/19/16  2:59 AM  Result Value Ref Range Status   Specimen Description BLOOD LEFT HAND  Final   Special  Requests IN PEDIATRIC BOTTLE 1CC  Final   Culture NO GROWTH 2 DAYS  Final   Report Status PENDING  Incomplete     Scheduled Meds: . cefTRIAXone (ROCEPHIN)  IV  2 g Intravenous Q24H  . enoxaparin (LOVENOX) injection  40 mg Subcutaneous Q24H  . fentaNYL (SUBLIMAZE) injection  50 mcg Intravenous Once  . midazolam  2 mg Intravenous Once  . sodium chloride flush  10-40 mL Intracatheter Q12H  . sodium chloride flush  3 mL Intravenous Q12H   Continuous Infusions:  Procedures/Studies: Dg Chest 2 View  Result Date: 06/18/2016 CLINICAL DATA:  Pt has had congestion with a productive cough w/yellowish phlegm. She was trying to inject crack intraveniously and missed her vein. She said her whole elbow feels tight and she's unable to straighten her arm out. EXAM: CHEST  2 VIEW COMPARISON:  None. FINDINGS: The heart size and mediastinal contours are within normal limits. Both lungs are clear. No pleural effusion or pneumothorax. The visualized skeletal structures are unremarkable. IMPRESSION: No active cardiopulmonary disease. Electronically Signed   By: Amie Portland M.D.   On: 06/18/2016 21:17   Dg Elbow Complete Right  Result Date: 06/18/2016 CLINICAL DATA:  Pt has had congestion with a productive cough w/yellowish phlegm. She was trying to inject crack intraveniously and missed her vein. She said her whole elbow feels tight and she's unable to straighten her arm out. EXAM: RIGHT ELBOW - COMPLETE 3+ VIEW COMPARISON:  None. FINDINGS: No fracture.  No bone lesion. The elbow joint is normally spaced and aligned. No arthropathic change. No joint effusion. There is subcutaneous soft tissue edema/cellulitis most evident along the dorsal aspect of the elbow and proximal forearm. No soft tissue air. IMPRESSION: 1. No fracture, bone lesion or elbow joint abnormality. 2. Soft tissue edema/cellulitis. Electronically Signed   By: Amie Portland M.D.   On: 06/18/2016 21:18   Ct [place] Right W Contrast  Result Date:  06/18/2016 CLINICAL DATA:  Acute onset of right elbow pain and swelling. Initial encounter. EXAM: CT OF THE UPPER RIGHT EXTREMITY WITH CONTRAST TECHNIQUE: Multidetector CT imaging of the upper right extremity was performed according to the standard protocol following intravenous contrast administration. COMPARISON:  None. CONTRAST:  ISOVUE-300 IOPAMIDOL (ISOVUE-300) INJECTION 61% FINDINGS: Bones/Joint/Cartilage There is no evidence of fracture or dislocation. No osseous erosion is seen. Note is made of a right elbow joint effusion, with mild peripheral enhancement, raising suspicion for septic joint. Ligaments Suboptimally assessed by CT. Muscles and Tendons An apparent tiny 1.2 x 0.8 cm intramuscular abscess is seen within the superficial musculature at the level of the antecubital fossa. The visualized musculature is otherwise grossly unremarkable. The visualized tendon structures are grossly unremarkable. Soft tissues Mild soft tissue edema is seen tracking along the dorsum of the forearm and elbow. There appears to be diffuse thrombophlebitis involving multiple veins at and above the antecubital fossa. IMPRESSION: 1. No evidence of fracture or dislocation. No evidence of osseous erosion. 2. Right elbow joint effusion, with mild peripheral enhancement, raising suspicion for septic joint. 3. Apparent tiny 1.2 cm intramuscular abscess within the superficial musculature at the level of the antecubital fossa. 4. Diffuse thrombophlebitis involving multiple veins at and above the antecubital fossa. 5. Mild soft tissue edema tracking along the dorsum of the forearm and elbow. Electronically Signed   By: Leotis Shames  Chang M.D.   On: 06/18/2016 22:40   Dg Chest Port 1 View  Result Date: 06/19/2016 CLINICAL DATA:  Central line placement.  Initial encounter. EXAM: PORTABLE CHEST 1 VIEW COMPARISON:  Chest radiograph performed 06/18/2016 FINDINGS: The lungs are well-aerated. Vascular congestion is noted. There is no  evidence of focal opacification, pleural effusion or pneumothorax. The cardiomediastinal silhouette is within normal limits. No acute osseous abnormalities are seen. A left subclavian line is noted ending about the mid SVC. IMPRESSION: Vascular congestion noted.  Lungs remain grossly clear. Electronically Signed   By: Roanna RaiderJeffery  Chang M.D.   On: 06/19/2016 04:21    Ilyas Lipsitz, DO  [facility] Pager [phone]  If 7PM-7AM, please contact night-coverage [email or web address] Password [email or web address] 06/22/2016, 8:55 AM   LOS: 4 days

## 2016-06-23 DIAGNOSIS — B1920 Unspecified viral hepatitis C without hepatic coma: Secondary | ICD-10-CM

## 2016-06-23 DIAGNOSIS — M009 Pyogenic arthritis, unspecified: Secondary | ICD-10-CM

## 2016-06-23 LAB — RENAL FUNCTION PANEL
Albumin: 2.5 g/dL — ABNORMAL LOW (ref 3.5–5.0)
Anion gap: 7 (ref 5–15)
BUN: 8 mg/dL (ref 6–20)
CHLORIDE: 105 mmol/L (ref 101–111)
CO2: 25 mmol/L (ref 22–32)
Calcium: 8.5 mg/dL — ABNORMAL LOW (ref 8.9–10.3)
Creatinine, Ser: 0.56 mg/dL (ref 0.44–1.00)
Glucose, Bld: 87 mg/dL (ref 65–99)
POTASSIUM: 3.9 mmol/L (ref 3.5–5.1)
Phosphorus: 3.8 mg/dL (ref 2.5–4.6)
Sodium: 137 mmol/L (ref 135–145)

## 2016-06-23 LAB — CBC WITH DIFFERENTIAL/PLATELET
BASOS ABS: 0 10*3/uL (ref 0.0–0.1)
Basophils Relative: 0 %
Eosinophils Absolute: 0.2 10*3/uL (ref 0.0–0.7)
Eosinophils Relative: 3 %
HEMATOCRIT: 31.2 % — AB (ref 36.0–46.0)
HEMOGLOBIN: 10.2 g/dL — AB (ref 12.0–15.0)
LYMPHS PCT: 37 %
Lymphs Abs: 2.4 10*3/uL (ref 0.7–4.0)
MCH: 26.1 pg (ref 26.0–34.0)
MCHC: 32.7 g/dL (ref 30.0–36.0)
MCV: 79.8 fL (ref 78.0–100.0)
MONOS PCT: 7 %
Monocytes Absolute: 0.5 10*3/uL (ref 0.1–1.0)
Neutro Abs: 3.4 10*3/uL (ref 1.7–7.7)
Neutrophils Relative %: 53 %
Platelets: 224 10*3/uL (ref 150–400)
RBC: 3.91 MIL/uL (ref 3.87–5.11)
RDW: 14.2 % (ref 11.5–15.5)
WBC: 6.5 10*3/uL (ref 4.0–10.5)

## 2016-06-23 LAB — HEPATITIS C VRS RNA DETECT BY PCR-QUAL: HEPATITIS C VRS RNA BY PCR-QUAL: POSITIVE — AB

## 2016-06-23 LAB — MAGNESIUM: MAGNESIUM: 2.1 mg/dL (ref 1.7–2.4)

## 2016-06-23 MED ORDER — SODIUM CHLORIDE 0.9 % IV BOLUS (SEPSIS)
1000.0000 mL | Freq: Once | INTRAVENOUS | Status: AC
  Administered 2016-06-23: 1000 mL via INTRAVENOUS

## 2016-06-23 MED ORDER — LEVOFLOXACIN 750 MG PO TABS: 750.0000 mg | ORAL_TABLET | Freq: Every day | ORAL | 0 refills | Status: AC

## 2016-06-23 NOTE — Progress Notes (Signed)
BP 85/52 HR 108. Paged Dr Tat. Dr Tat ordered manual BP and to hold IV fentanyl for now. Manual BP taken 80/48. Paged Dr Tat with manual results also. Pt upset about not receiving fentanyl. Educated pt regarding low BP and fentanyl. Waiting to hear from dr. Burt KnackHolding Catapres as well. Will continue to monitor pt.

## 2016-06-23 NOTE — Progress Notes (Signed)
Instructed pt on procedure. Instructed to take a deep breath upon line removal. HOB less than 45 degrees. Pressure held for . No s/sx of bleeding at this time. Pt instructed on s/sx of bleeding and to monitor for. Pressure drsg applied, instructed to keep drsg CDI for 24 hrs. Instructed to remain lying down for after line pulled. Pt refused to do as instructed. Nurse reinforced risks of not complying. Pts. Floor nurse notified. Pt VU.

## 2016-06-23 NOTE — Progress Notes (Signed)
Pt called nurse stating, "I'm leaving. I've already called a ride to come get me. Nothing you say is going to keep me from leaving. Someone died in my family". Teacher, English as a foreign languageCalled Charge nurse and Dr Tat. IV team also paged to remove CVC. Giving pt prescription antibiotics and educated on importance of getting prescriptions filled and taking all of the antibiotics. Pt said she would fill them and take them. AMA paper signed and CVC catheter was removed prior to pt leaving.

## 2016-06-23 NOTE — Discharge Summary (Signed)
Physician Discharge Summary  Anis Cinelli ZOX:[id number] DOB: 23-Jul-1995 DOA: 06/18/2016  PCP: No primary care provider on file.  Admit date: 06/18/2016 Discharge date: 06/23/2016  Admitted From:  Home Disposition: Home  LEAVING AGAINST MEDICAL ADVICE     Discharge Condition:LEAVING AGAINST MEDICAL ADVICE CODE STATUS: full    Brief/Interim Summary: 20 y/o woman with a history of IV drug use, injects heroin and smokes cocaine. Patient presents to the ED with complaint of right elbow pain, rash, cough and congestion, fever. Pt states approximately a week ago she injected right AC with crack cocaine "wash." She explains that is when a cotton is soaked and crack cocaine and vinegar, and then this mixture is drawn and injected. Patient states in her case this was old and she just had leftovers from a couple of days that she drew up and injected. She states since then she developed pain and swelling to the right elbow. She had pain with full extension of the joint. She is also reporting cough and congestion with green productive sputum for 1-2 weeks. She states on the day of admission, she broke out in a rash in her extremities. She believes she developed fever at some point on the day of admission. She has been staying in a hotel with an elderly couple that picked her up that has dogs. States rash is pruritic. She was given Benadryl by EMS prior to coming in.  Pt remained hypotensive despite 5L IV fluids and was eventually started on levophed and admitted to the ICU. She improved clinically and was transferred to the medical floor.  Psychiatry saw the pt and she was deemed to have capacity.   As a result, Her IVC was rescinded. The following day the patient wanted to leave against medical advice.  I spoke with the patient and explained the risks including, but not limited to worsening infection, sepsis, death.  She expressed understanding and still wanted to leave AMA.  Discharge  Diagnoses:  Sepsis -Secondary to Streptococcus biogenous bacteremia and septic right elbow -Continue ceftriaxone -Patient is afebrile, but blood pressures remained soft -pt initially confused and IVC'ed initially -12/7/17psychiatry for capacity evaluation-->pt has capacity to make decisions -IVC rescinded -06/23/16--pt leaving AMA--Rx given for levofloxacin 750 mg x 10 days  Streptococcus by allergies bacteremia -Continue ceftriaxone D#4 -appreciate ID follow up -TEE requested--likely 12/7 or 12/8 -surveillance cultures NGTD  Septic right elbow -Status post I&D 06/19/2016, Dr. Doneen Poisson  Polysubstance abuse/IVDU -Including cocaine and heroin  Hep C antibody positive -HCV RNA and genotype pending -HIV neg -Hep A/B negative -RPR neg -GC/Chlamydia neg   Discharge Instructions     Medication List    TAKE these medications   levofloxacin 750 MG tablet Commonly known as:  LEVAQUIN Take 1 tablet (750 mg total) by mouth daily.      Follow-up Information    Kathryne Hitch, MD. Schedule an appointment as soon as possible for a visit in 2 day(s).   Specialty:  Orthopedic Surgery Contact information: [address] [place] [address] [phone]          Allergies  Allergen Reactions  . Amoxicillin Swelling  . Penicillins Swelling    Consultations:  ID   Procedures/Studies: Dg Chest 2 View  Result Date: 06/18/2016 CLINICAL DATA:  Pt has had congestion with a productive cough w/yellowish phlegm. She was trying to inject crack intraveniously and missed her vein. She said her whole elbow feels tight and she's unable to straighten her arm out. EXAM:  CHEST  2 VIEW COMPARISON:  None. FINDINGS: The heart size and mediastinal contours are within normal limits. Both lungs are clear. No pleural effusion or pneumothorax. The visualized skeletal structures are unremarkable. IMPRESSION: No active cardiopulmonary disease. Electronically  Signed   By: Amie Portlandavid  Ormond M.D.   On: 06/18/2016 21:17   Dg Elbow Complete Right  Result Date: 06/18/2016 CLINICAL DATA:  Pt has had congestion with a productive cough w/yellowish phlegm. She was trying to inject crack intraveniously and missed her vein. She said her whole elbow feels tight and she's unable to straighten her arm out. EXAM: RIGHT ELBOW - COMPLETE 3+ VIEW COMPARISON:  None. FINDINGS: No fracture.  No bone lesion. The elbow joint is normally spaced and aligned. No arthropathic change. No joint effusion. There is subcutaneous soft tissue edema/cellulitis most evident along the dorsal aspect of the elbow and proximal forearm. No soft tissue air. IMPRESSION: 1. No fracture, bone lesion or elbow joint abnormality. 2. Soft tissue edema/cellulitis. Electronically Signed   By: Amie Portlandavid  Ormond M.D.   On: 06/18/2016 21:18   Ct Eblow Right W Contrast  Result Date: 06/18/2016 CLINICAL DATA:  Acute onset of right elbow pain and swelling. Initial encounter. EXAM: CT OF THE UPPER RIGHT EXTREMITY WITH CONTRAST TECHNIQUE: Multidetector CT imaging of the upper right extremity was performed according to the standard protocol following intravenous contrast administration. COMPARISON:  None. CONTRAST:  100mL ISOVUE-300 IOPAMIDOL (ISOVUE-300) INJECTION 61% FINDINGS: Bones/Joint/Cartilage There is no evidence of fracture or dislocation. No osseous erosion is seen. Note is made of a right elbow joint effusion, with mild peripheral enhancement, raising suspicion for septic joint. Ligaments Suboptimally assessed by CT. Muscles and Tendons An apparent tiny 1.2 x 0.8 cm intramuscular abscess is seen within the superficial musculature at the level of the antecubital fossa. The visualized musculature is otherwise grossly unremarkable. The visualized tendon structures are grossly unremarkable. Soft tissues Mild soft tissue edema is seen tracking along the dorsum of the forearm and elbow. There appears to be diffuse  thrombophlebitis involving multiple veins at and above the antecubital fossa. IMPRESSION: 1. No evidence of fracture or dislocation. No evidence of osseous erosion. 2. Right elbow joint effusion, with mild peripheral enhancement, raising suspicion for septic joint. 3. Apparent tiny 1.2 cm intramuscular abscess within the superficial musculature at the level of the antecubital fossa. 4. Diffuse thrombophlebitis involving multiple veins at and above the antecubital fossa. 5. Mild soft tissue edema tracking along the dorsum of the forearm and elbow. Electronically Signed   By: Roanna RaiderJeffery  Chang M.D.   On: 06/18/2016 22:40   Dg Chest Port 1 View  Result Date: 06/19/2016 CLINICAL DATA:  Central line placement.  Initial encounter. EXAM: PORTABLE CHEST 1 VIEW COMPARISON:  Chest radiograph performed 06/18/2016 FINDINGS: The lungs are well-aerated. Vascular congestion is noted. There is no evidence of focal opacification, pleural effusion or pneumothorax. The cardiomediastinal silhouette is within normal limits. No acute osseous abnormalities are seen. A left subclavian line is noted ending about the mid SVC. IMPRESSION: Vascular congestion noted.  Lungs remain grossly clear. Electronically Signed   By: Roanna RaiderJeffery  Chang M.D.   On: 06/19/2016 04:21        Discharge Exam: Vitals:   06/23/16 1233 06/23/16 1435  BP: (!) 92/58 (!) 98/57  Pulse:  83  Resp:    Temp:     Vitals:   06/23/16 1030 06/23/16 1231 06/23/16 1233 06/23/16 1435  BP: 92/62 (!) 89/52 (!) 92/58 (!) 98/57  Pulse: (!) 114  90  83  Resp:      Temp:  98 F (36.7 C)    TempSrc:  Oral    SpO2:  99%    Weight:      Height:        General: Pt is alert, awake, not in acute distress Cardiovascular: RRR, S1/S2 +, no rubs, no gallops Respiratory: CTA bilaterally, no wheezing, no rhonchi Abdominal: Soft, NT, ND, bowel sounds + Extremities: no edema, no cyanosis   The results of significant diagnostics from this hospitalization (including  imaging, microbiology, ancillary and laboratory) are listed below for reference.    Significant Diagnostic Studies: Dg Chest 2 View  Result Date: 06/18/2016 CLINICAL DATA:  Pt has had congestion with a productive cough w/yellowish phlegm. She was trying to inject crack intraveniously and missed her vein. She said her whole elbow feels tight and she's unable to straighten her arm out. EXAM: CHEST  2 VIEW COMPARISON:  None. FINDINGS: The heart size and mediastinal contours are within normal limits. Both lungs are clear. No pleural effusion or pneumothorax. The visualized skeletal structures are unremarkable. IMPRESSION: No active cardiopulmonary disease. Electronically Signed   By: Amie Portlandavid  Ormond M.D.   On: 06/18/2016 21:17   Dg Elbow Complete Right  Result Date: 06/18/2016 CLINICAL DATA:  Pt has had congestion with a productive cough w/yellowish phlegm. She was trying to inject crack intraveniously and missed her vein. She said her whole elbow feels tight and she's unable to straighten her arm out. EXAM: RIGHT ELBOW - COMPLETE 3+ VIEW COMPARISON:  None. FINDINGS: No fracture.  No bone lesion. The elbow joint is normally spaced and aligned. No arthropathic change. No joint effusion. There is subcutaneous soft tissue edema/cellulitis most evident along the dorsal aspect of the elbow and proximal forearm. No soft tissue air. IMPRESSION: 1. No fracture, bone lesion or elbow joint abnormality. 2. Soft tissue edema/cellulitis. Electronically Signed   By: Amie Portlandavid  Ormond M.D.   On: 06/18/2016 21:18   Ct Eblow Right W Contrast  Result Date: 06/18/2016 CLINICAL DATA:  Acute onset of right elbow pain and swelling. Initial encounter. EXAM: CT OF THE UPPER RIGHT EXTREMITY WITH CONTRAST TECHNIQUE: Multidetector CT imaging of the upper right extremity was performed according to the standard protocol following intravenous contrast administration. COMPARISON:  None. CONTRAST:  100mL ISOVUE-300 IOPAMIDOL (ISOVUE-300)  INJECTION 61% FINDINGS: Bones/Joint/Cartilage There is no evidence of fracture or dislocation. No osseous erosion is seen. Note is made of a right elbow joint effusion, with mild peripheral enhancement, raising suspicion for septic joint. Ligaments Suboptimally assessed by CT. Muscles and Tendons An apparent tiny 1.2 x 0.8 cm intramuscular abscess is seen within the superficial musculature at the level of the antecubital fossa. The visualized musculature is otherwise grossly unremarkable. The visualized tendon structures are grossly unremarkable. Soft tissues Mild soft tissue edema is seen tracking along the dorsum of the forearm and elbow. There appears to be diffuse thrombophlebitis involving multiple veins at and above the antecubital fossa. IMPRESSION: 1. No evidence of fracture or dislocation. No evidence of osseous erosion. 2. Right elbow joint effusion, with mild peripheral enhancement, raising suspicion for septic joint. 3. Apparent tiny 1.2 cm intramuscular abscess within the superficial musculature at the level of the antecubital fossa. 4. Diffuse thrombophlebitis involving multiple veins at and above the antecubital fossa. 5. Mild soft tissue edema tracking along the dorsum of the forearm and elbow. Electronically Signed   By: Roanna RaiderJeffery  Chang M.D.   On: 06/18/2016 22:40  Dg Chest Port 1 View  Result Date: 06/19/2016 CLINICAL DATA:  Central line placement.  Initial encounter. EXAM: PORTABLE CHEST 1 VIEW COMPARISON:  Chest radiograph performed 06/18/2016 FINDINGS: The lungs are well-aerated. Vascular congestion is noted. There is no evidence of focal opacification, pleural effusion or pneumothorax. The cardiomediastinal silhouette is within normal limits. No acute osseous abnormalities are seen. A left subclavian line is noted ending about the mid SVC. IMPRESSION: Vascular congestion noted.  Lungs remain grossly clear. Electronically Signed   By: Roanna Raider M.D.   On: 06/19/2016 04:21      Microbiology: Recent Results (from the past 240 hour(s))  Blood Culture (routine x 2)     Status: Abnormal   Collection Time: 06/18/16  8:12 PM  Result Value Ref Range Status   Specimen Description BLOOD LEFT HAND  Final   Special Requests IN PEDIATRIC BOTTLE 4CC  Final   Culture  Setup Time   Final    GRAM POSITIVE COCCI IN CHAINS IN PEDIATRIC BOTTLE CRITICAL RESULT CALLED TO, READ BACK BY AND VERIFIED WITH: R RUMBARGER  06/19/16 @ 1050 M VESTAL    Culture (A)  Final    GROUP A STREP (S.PYOGENES) ISOLATED HEALTH DEPARTMENT NOTIFIED    Report Status 06/21/2016 FINAL  Final   Organism ID, Bacteria GROUP A STREP (S.PYOGENES) ISOLATED  Final      Susceptibility   Group a strep (s.pyogenes) isolated - MIC*    ERYTHROMYCIN 4 RESISTANT Resistant     TETRACYCLINE >=16 RESISTANT Resistant     VANCOMYCIN <=0.12 SENSITIVE Sensitive     CLINDAMYCIN <=0.25 RESISTANT Resistant     Inducible Clindamycin POSITIVE Resistant     PENICILLIN <=0.06 SENSITIVE Sensitive     * GROUP A STREP (S.PYOGENES) ISOLATED  Blood Culture ID Panel (Reflexed)     Status: Abnormal   Collection Time: 06/18/16  8:12 PM  Result Value Ref Range Status   Enterococcus species NOT DETECTED NOT DETECTED Final   Listeria monocytogenes NOT DETECTED NOT DETECTED Final   Staphylococcus species NOT DETECTED NOT DETECTED Final   Staphylococcus aureus NOT DETECTED NOT DETECTED Final   Streptococcus species DETECTED (A) NOT DETECTED Final    Comment: CRITICAL RESULT CALLED TO, READ BACK BY AND VERIFIED WITH: R RUMBARGER  06/19/16 @ 1050 M VESTAL    Streptococcus agalactiae NOT DETECTED NOT DETECTED Final   Streptococcus pneumoniae NOT DETECTED NOT DETECTED Final   Streptococcus pyogenes DETECTED (A) NOT DETECTED Final    Comment: CRITICAL RESULT CALLED TO, READ BACK BY AND VERIFIED WITH: R RUMBARGER  06/19/16 @ 1050 M VESTAL    Acinetobacter baumannii NOT DETECTED NOT DETECTED Final   Enterobacteriaceae species NOT  DETECTED NOT DETECTED Final   Enterobacter cloacae complex NOT DETECTED NOT DETECTED Final   Escherichia coli NOT DETECTED NOT DETECTED Final   Klebsiella oxytoca NOT DETECTED NOT DETECTED Final   Klebsiella pneumoniae NOT DETECTED NOT DETECTED Final   Proteus species NOT DETECTED NOT DETECTED Final   Serratia marcescens NOT DETECTED NOT DETECTED Final   Haemophilus influenzae NOT DETECTED NOT DETECTED Final   Neisseria meningitidis NOT DETECTED NOT DETECTED Final   Pseudomonas aeruginosa NOT DETECTED NOT DETECTED Final   Candida albicans NOT DETECTED NOT DETECTED Final   Candida glabrata NOT DETECTED NOT DETECTED Final   Candida krusei NOT DETECTED NOT DETECTED Final   Candida parapsilosis NOT DETECTED NOT DETECTED Final   Candida tropicalis NOT DETECTED NOT DETECTED Final  Urine culture  Status: Abnormal   Collection Time: 06/18/16  8:27 PM  Result Value Ref Range Status   Specimen Description URINE, CLEAN CATCH  Final   Special Requests NONE  Final   Culture MULTIPLE SPECIES PRESENT, SUGGEST RECOLLECTION (A)  Final   Report Status 06/20/2016 FINAL  Final  Blood Culture (routine x 2)     Status: Abnormal   Collection Time: 06/18/16  8:36 PM  Result Value Ref Range Status   Specimen Description BLOOD BLOOD LEFT FOREARM  Final   Special Requests BOTTLES DRAWN AEROBIC AND ANAEROBIC 5CC  Final   Culture  Setup Time   Final    GRAM POSITIVE COCCI IN CHAINS IN BOTH AEROBIC AND ANAEROBIC BOTTLES CRITICAL VALUE NOTED.  VALUE IS CONSISTENT WITH PREVIOUSLY REPORTED AND CALLED VALUE.    Culture (A)  Final    GROUP A STREP (S.PYOGENES) ISOLATED SUSCEPTIBILITIES PERFORMED ON PREVIOUS CULTURE WITHIN THE LAST 5 DAYS. HEALTH DEPARTMENT NOTIFIED    Report Status 06/21/2016 FINAL  Final  MRSA PCR Screening     Status: None   Collection Time: 06/19/16  1:26 AM  Result Value Ref Range Status   MRSA by PCR NEGATIVE NEGATIVE Final    Comment:        The GeneXpert MRSA Assay (FDA approved  for NASAL specimens only), is one component of a comprehensive MRSA colonization surveillance program. It is not intended to diagnose MRSA infection nor to guide or monitor treatment for MRSA infections.   Culture, blood (single)     Status: None (Preliminary result)   Collection Time: 06/19/16  2:59 AM  Result Value Ref Range Status   Specimen Description BLOOD LEFT HAND  Final   Special Requests IN PEDIATRIC BOTTLE 1CC  Final   Culture NO GROWTH 4 DAYS  Final   Report Status PENDING  Incomplete  Culture, blood (routine x 2)     Status: None (Preliminary result)   Collection Time: 06/21/16 11:26 AM  Result Value Ref Range Status   Specimen Description BLOOD LEFT HAND  Final   Special Requests IN PEDIATRIC BOTTLE 1CC  Final   Culture NO GROWTH 2 DAYS  Final   Report Status PENDING  Incomplete     Labs: Basic Metabolic Panel:  Recent Labs Lab 06/19/16 0304 06/20/16 0335 06/21/16 0455 06/22/16 0530 06/23/16 0403 06/23/16 0408  NA 134* 133* 134* 136 137  --   K 3.3* 3.4* 3.1* 3.6 3.9  --   CL 105 102 103 102 105  --   CO2 22 26 26 25 25   --   GLUCOSE 97 117* 79 82 87  --   BUN 5* 11 13 7 8   --   CREATININE 0.61 0.48 0.66 0.57 0.56  --   CALCIUM 7.4* 8.0* 7.9* 8.4* 8.5*  --   MG  --  1.9 2.0 1.9  --  2.1  PHOS  --  3.0 3.5 4.4 3.8  --    Liver Function Tests:  Recent Labs Lab 06/18/16 2000 06/21/16 0455 06/22/16 0530 06/23/16 0403  AST 44*  --   --   --   ALT 28  --   --   --   ALKPHOS 47  --   --   --   BILITOT 0.6  --   --   --   PROT 5.7*  --   --   --   ALBUMIN 2.3* 1.7* 2.3* 2.5*   No results for input(s): LIPASE, AMYLASE in the last 168  hours. No results for input(s): AMMONIA in the last 168 hours. CBC:  Recent Labs Lab 06/18/16 2000 06/19/16 0304 06/20/16 0335 06/21/16 0455 06/22/16 0530 06/23/16 0408  WBC 9.0 8.0 4.8 6.9 6.6 6.5  NEUTROABS 7.8*  --   --  3.8 3.4 3.4  HGB 11.1* 10.7* 8.8* 9.6* 10.3* 10.2*  HCT 32.3* 32.0* 25.6* 28.5*  31.4* 31.2*  MCV 77.8* 77.9* 77.1* 78.1 79.3 79.8  PLT 141* 107* 101* 136* 181 224   Cardiac Enzymes: No results for input(s): CKTOTAL, CKMB, CKMBINDEX, TROPONINI in the last 168 hours. BNP: Invalid input(s): POCBNP CBG:  Recent Labs Lab 06/19/16 0139 06/19/16 0747 06/21/16 0429 06/21/16 0753  GLUCAP 112* 108* 80 78    Time coordinating discharge:  Greater than 30 minutes  Signed:  Mairin Lindsley, DO [facility] Pager: [phone] 06/23/2016, 3:33 PM

## 2016-06-23 NOTE — Progress Notes (Signed)
INFECTIOUS DISEASE PROGRESS NOTE  ID: Megan Farmer is a 20 y.o. female with  Principal Problem:   Sepsis associated hypotension (HCC) Active Problems:   IVDU (intravenous drug user)   Septic shock (HCC)   Cellulitis of right arm   Joint infection (HCC)   Group A streptococcal infection   Hypokalemia   Sepsis due to Streptococcus, group A (HCC)  Subjective: Without complaints.  Wants to leave  Abtx:  Anti-infectives    Start     Dose/Rate Route Frequency Ordered Stop   06/23/16 0000  levofloxacin (LEVAQUIN) 750 MG tablet     750 mg Oral Daily 06/23/16 1512     06/20/16 1700  cefTRIAXone (ROCEPHIN) 2 g in dextrose 5 % 50 mL IVPB     2 g 100 mL/hr over 30 Minutes Intravenous Every 24 hours 06/20/16 1553     06/19/16 2100  levofloxacin (LEVAQUIN) IVPB 750 mg  Status:  Discontinued     750 mg 100 mL/hr over 90 Minutes Intravenous Every 24 hours 06/18/16 2048 06/19/16 1055   06/19/16 1200  clindamycin (CLEOCIN) IVPB 600 mg  Status:  Discontinued     600 mg 100 mL/hr over 30 Minutes Intravenous Every 8 hours 06/19/16 1057 06/20/16 1553   06/19/16 1200  vancomycin (VANCOCIN) 500 mg in sodium chloride 0.9 % 100 mL IVPB  Status:  Discontinued     500 mg 100 mL/hr over 60 Minutes Intravenous Every 8 hours 06/19/16 1105 06/20/16 1553   06/19/16 1000  vancomycin (VANCOCIN) IVPB 750 mg/150 ml premix  Status:  Discontinued     750 mg 150 mL/hr over 60 Minutes Intravenous Every 12 hours 06/18/16 2048 06/19/16 1104   06/19/16 0600  aztreonam (AZACTAM) 1 g in dextrose 5 % 50 mL IVPB  Status:  Discontinued     1 g 100 mL/hr over 30 Minutes Intravenous Every 8 hours 06/18/16 2048 06/19/16 1055   06/18/16 2045  levofloxacin (LEVAQUIN) IVPB 750 mg     750 mg 100 mL/hr over 90 Minutes Intravenous  Once 06/18/16 2030 06/18/16 2323   06/18/16 2045  aztreonam (AZACTAM) 2 g in dextrose 5 % 50 mL IVPB     2 g 100 mL/hr over 30 Minutes Intravenous  Once 06/18/16 2030 06/18/16 2229   06/18/16 2045  vancomycin (VANCOCIN) IVPB 1000 mg/200 mL premix     1,000 mg 200 mL/hr over 60 Minutes Intravenous  Once 06/18/16 2030 06/18/16 2326      Medications:  Scheduled: . cefTRIAXone (ROCEPHIN)  IV  2 g Intravenous Q24H  . cloNIDine  0.1 mg Oral QID   Followed by  . [START ON 06/25/2016] cloNIDine  0.1 mg Oral BH-qamhs   Followed by  . [START ON 06/27/2016] cloNIDine  0.1 mg Oral QAC breakfast  . enoxaparin (LOVENOX) injection  40 mg Subcutaneous Q24H  . fentaNYL (SUBLIMAZE) injection  50 mcg Intravenous Once  . midazolam  2 mg Intravenous Once  . sodium chloride flush  10-40 mL Intracatheter Q12H  . sodium chloride flush  3 mL Intravenous Q12H    Objective: Vital signs in last 24 hours: Temp:  [98 F (36.7 C)-98.1 F (36.7 C)] 98 F (36.7 C) (12/07 1231) Pulse Rate:  [83-114] 83 (12/07 1435) Resp:  [19] 19 (12/07 0634) BP: (80-98)/(48-62) 98/57 (12/07 1435) SpO2:  [95 %-99 %] 99 % (12/07 1231)   General appearance: alert and no distress Resp: clear to auscultation bilaterally Cardio: regular rate and rhythm GI: normal findings: bowel sounds  normal and soft, non-tender  Lab Results  Recent Labs  06/22/16 0530 06/23/16 0403 06/23/16 0408  WBC 6.6  --  6.5  HGB 10.3*  --  10.2*  HCT 31.4*  --  31.2*  NA 136 137  --   K 3.6 3.9  --   CL 102 105  --   CO2 25 25  --   BUN 7 8  --   CREATININE 0.57 0.56  --    Liver Panel  Recent Labs  06/22/16 0530 06/23/16 0403  ALBUMIN 2.3* 2.5*   Sedimentation Rate No results for input(s): ESRSEDRATE in the last 72 hours. C-Reactive Protein No results for input(s): CRP in the last 72 hours.  Microbiology: Recent Results (from the past 240 hour(s))  Blood Culture (routine x 2)     Status: Abnormal   Collection Time: 06/18/16  8:12 PM  Result Value Ref Range Status   Specimen Description BLOOD LEFT HAND  Final   Special Requests IN PEDIATRIC BOTTLE 4CC  Final   Culture  Setup Time   Final    GRAM  POSITIVE COCCI IN CHAINS IN PEDIATRIC BOTTLE CRITICAL RESULT CALLED TO, READ BACK BY AND VERIFIED WITH: R RUMBARGER  06/19/16 @ 1050 M VESTAL    Culture (A)  Final    GROUP A STREP (S.PYOGENES) ISOLATED HEALTH DEPARTMENT NOTIFIED    Report Status 06/21/2016 FINAL  Final   Organism ID, Bacteria GROUP A STREP (S.PYOGENES) ISOLATED  Final      Susceptibility   Group a strep (s.pyogenes) isolated - MIC*    ERYTHROMYCIN 4 RESISTANT Resistant     TETRACYCLINE >=16 RESISTANT Resistant     VANCOMYCIN <=0.12 SENSITIVE Sensitive     CLINDAMYCIN <=0.25 RESISTANT Resistant     Inducible Clindamycin POSITIVE Resistant     PENICILLIN <=0.06 SENSITIVE Sensitive     * GROUP A STREP (S.PYOGENES) ISOLATED  Blood Culture ID Panel (Reflexed)     Status: Abnormal   Collection Time: 06/18/16  8:12 PM  Result Value Ref Range Status   Enterococcus species NOT DETECTED NOT DETECTED Final   Listeria monocytogenes NOT DETECTED NOT DETECTED Final   Staphylococcus species NOT DETECTED NOT DETECTED Final   Staphylococcus aureus NOT DETECTED NOT DETECTED Final   Streptococcus species DETECTED (A) NOT DETECTED Final    Comment: CRITICAL RESULT CALLED TO, READ BACK BY AND VERIFIED WITH: R RUMBARGER  06/19/16 @ 1050 M VESTAL    Streptococcus agalactiae NOT DETECTED NOT DETECTED Final   Streptococcus pneumoniae NOT DETECTED NOT DETECTED Final   Streptococcus pyogenes DETECTED (A) NOT DETECTED Final    Comment: CRITICAL RESULT CALLED TO, READ BACK BY AND VERIFIED WITH: R RUMBARGER  06/19/16 @ 1050 M VESTAL    Acinetobacter baumannii NOT DETECTED NOT DETECTED Final   Enterobacteriaceae species NOT DETECTED NOT DETECTED Final   Enterobacter cloacae complex NOT DETECTED NOT DETECTED Final   Escherichia coli NOT DETECTED NOT DETECTED Final   Klebsiella oxytoca NOT DETECTED NOT DETECTED Final   Klebsiella pneumoniae NOT DETECTED NOT DETECTED Final   Proteus species NOT DETECTED NOT DETECTED Final   Serratia  marcescens NOT DETECTED NOT DETECTED Final   Haemophilus influenzae NOT DETECTED NOT DETECTED Final   Neisseria meningitidis NOT DETECTED NOT DETECTED Final   Pseudomonas aeruginosa NOT DETECTED NOT DETECTED Final   Candida albicans NOT DETECTED NOT DETECTED Final   Candida glabrata NOT DETECTED NOT DETECTED Final   Candida krusei NOT DETECTED NOT DETECTED Final  Candida parapsilosis NOT DETECTED NOT DETECTED Final   Candida tropicalis NOT DETECTED NOT DETECTED Final  Urine culture     Status: Abnormal   Collection Time: 06/18/16  8:27 PM  Result Value Ref Range Status   Specimen Description URINE, CLEAN CATCH  Final   Special Requests NONE  Final   Culture MULTIPLE SPECIES PRESENT, SUGGEST RECOLLECTION (A)  Final   Report Status 06/20/2016 FINAL  Final  Blood Culture (routine x 2)     Status: Abnormal   Collection Time: 06/18/16  8:36 PM  Result Value Ref Range Status   Specimen Description BLOOD BLOOD LEFT FOREARM  Final   Special Requests BOTTLES DRAWN AEROBIC AND ANAEROBIC 5CC  Final   Culture  Setup Time   Final    GRAM POSITIVE COCCI IN CHAINS IN BOTH AEROBIC AND ANAEROBIC BOTTLES CRITICAL VALUE NOTED.  VALUE IS CONSISTENT WITH PREVIOUSLY REPORTED AND CALLED VALUE.    Culture (A)  Final    GROUP A STREP (S.PYOGENES) ISOLATED SUSCEPTIBILITIES PERFORMED ON PREVIOUS CULTURE WITHIN THE LAST 5 DAYS. HEALTH DEPARTMENT NOTIFIED    Report Status 06/21/2016 FINAL  Final  MRSA PCR Screening     Status: None   Collection Time: 06/19/16  1:26 AM  Result Value Ref Range Status   MRSA by PCR NEGATIVE NEGATIVE Final    Comment:        The GeneXpert MRSA Assay (FDA approved for NASAL specimens only), is one component of a comprehensive MRSA colonization surveillance program. It is not intended to diagnose MRSA infection nor to guide or monitor treatment for MRSA infections.   Culture, blood (single)     Status: None (Preliminary result)   Collection Time: 06/19/16  2:59 AM    Result Value Ref Range Status   Specimen Description BLOOD LEFT HAND  Final   Special Requests IN PEDIATRIC BOTTLE 1CC  Final   Culture NO GROWTH 4 DAYS  Final   Report Status PENDING  Incomplete  Culture, blood (routine x 2)     Status: None (Preliminary result)   Collection Time: 06/21/16 11:26 AM  Result Value Ref Range Status   Specimen Description BLOOD LEFT HAND  Final   Special Requests IN PEDIATRIC BOTTLE 1CC  Final   Culture NO GROWTH 2 DAYS  Final   Report Status PENDING  Incomplete    Studies/Results: No results found.   Assessment/Plan: Group A Strep Sepsis IVDA Hepatitis C  Total days of antibiotics: 4 ceftriaxone  She is allergic to amoxil Home with levaquin Encourage her to f/u with PCP States she is going to live with her rmom Hep C treatment when she is clean.  My great appreciation to Dr Tat.          Megan Farmer Infectious Diseases (pager) [phone] [email or web address] 06/23/2016, 3:16 PM  LOS: 5 days

## 2016-06-23 NOTE — Progress Notes (Signed)
[facility]  Osborne Cascoadia Abdias Hickam LCSWA [phone]

## 2016-06-24 LAB — CULTURE, BLOOD (SINGLE): CULTURE: NO GROWTH

## 2016-06-24 SURGERY — ECHOCARDIOGRAM, TRANSESOPHAGEAL
Anesthesia: Monitor Anesthesia Care

## 2016-06-26 LAB — CULTURE, BLOOD (ROUTINE X 2): CULTURE: NO GROWTH

## 2016-06-27 LAB — HEPATITIS C GENOTYPE

## 2017-04-14 IMAGING — CT CT ELBOW*R* W/CM
3 series · 12 of 35 positions shown, 14 images · IV contrast (agent unspecified)
Comparison: None.

CONTRAST:  100mL KDP2TL-NDD IOPAMIDOL (KDP2TL-NDD) INJECTION 61%

CLINICAL DATA: Acute onset of right elbow pain and swelling.
Initial encounter.

EXAM:
CT OF THE UPPER RIGHT EXTREMITY WITH CONTRAST
TECHNIQUE: Multidetector CT imaging of the upper right extremity was performed
according to the standard protocol following intravenous contrast
administration.

[Series 4: 2 1.5 mm st ax · axial · 0.33mm/px · z∈[-176,-60]mm · 4 of 110 slices shown, 5 images]
[im 17/110  soft-tissue]
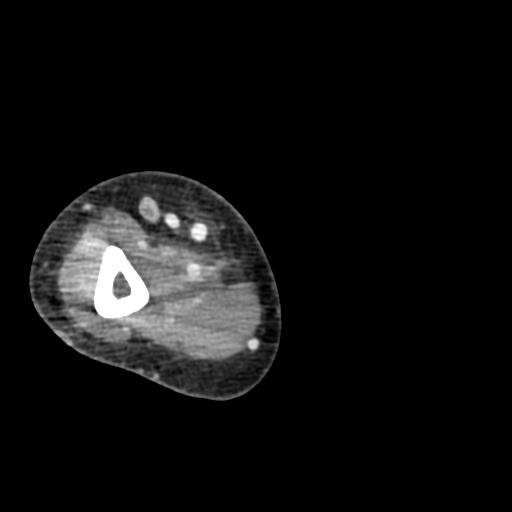
[im 17/110  bone]
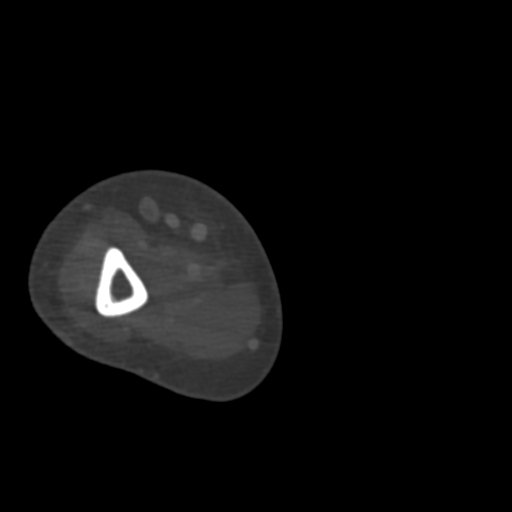
[im 42/110  bone]
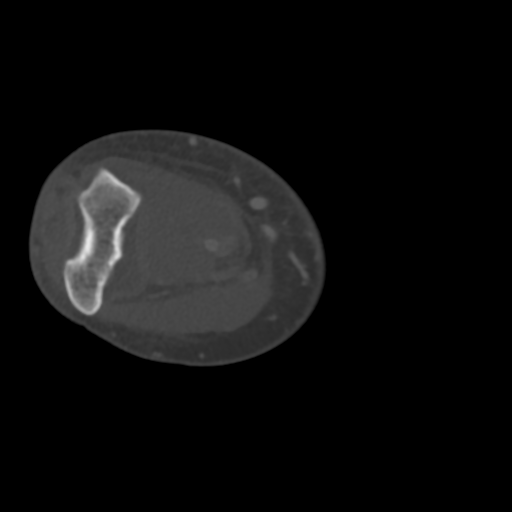
[im 68/110  bone]
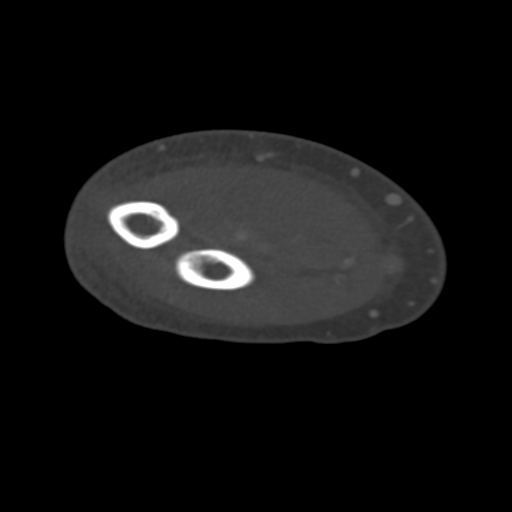
[im 93/110  bone]
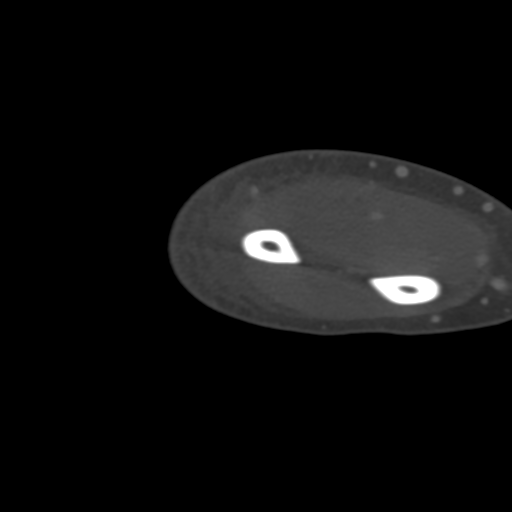

[Series 10: sag 1.5 mm st · coronal · 0.24mm/px · 3 of 53 slices shown]
[im 11/53  bone]
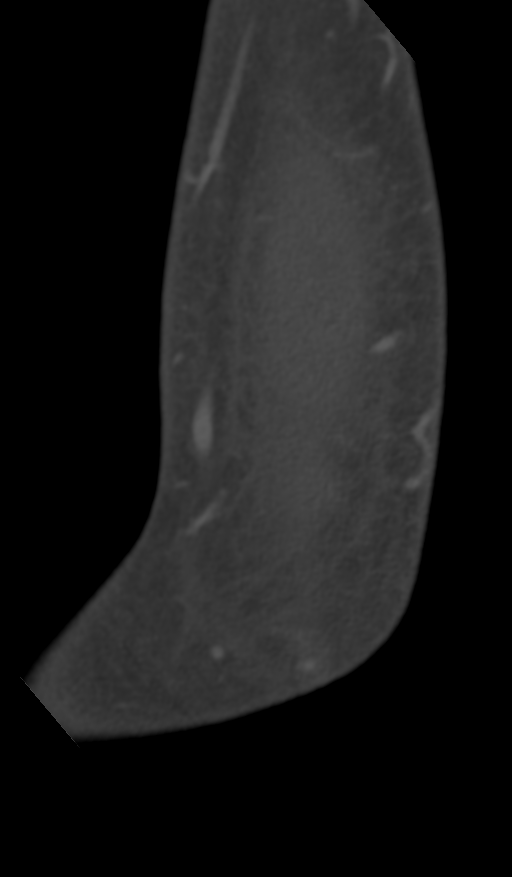
[im 21/53  bone]
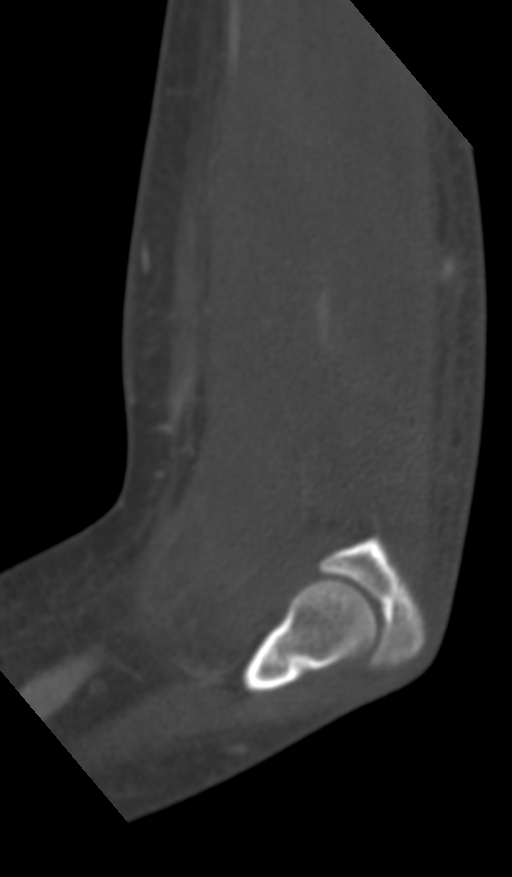
[im 32/53  bone]
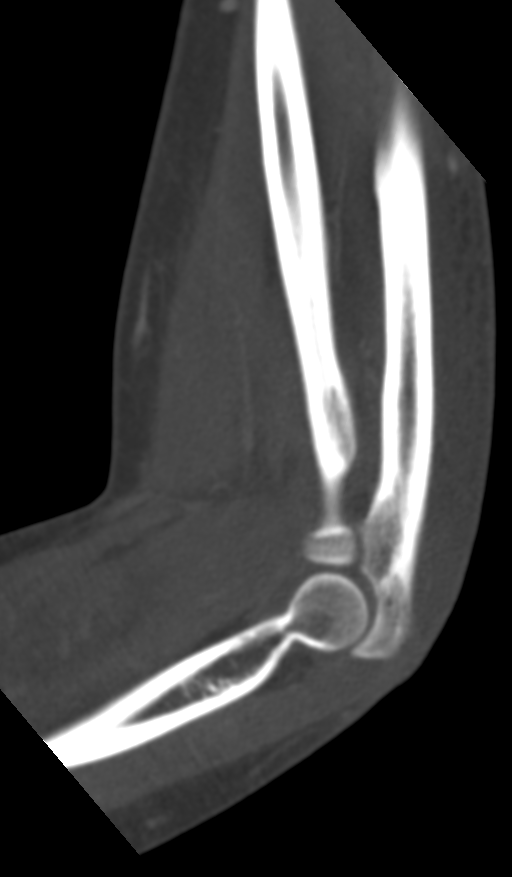

[Series 14: cor soft 2 · sagittal · 0.26mm/px · 5 of 139 slices shown, 6 images]
[im 47/139  bone]
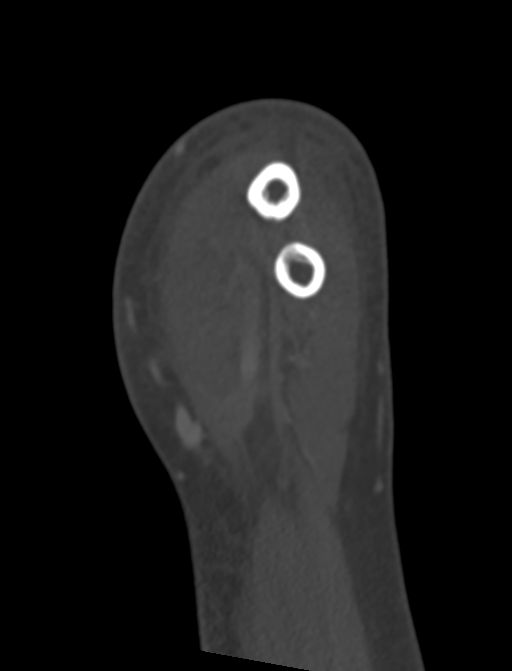
[im 58/139  bone]
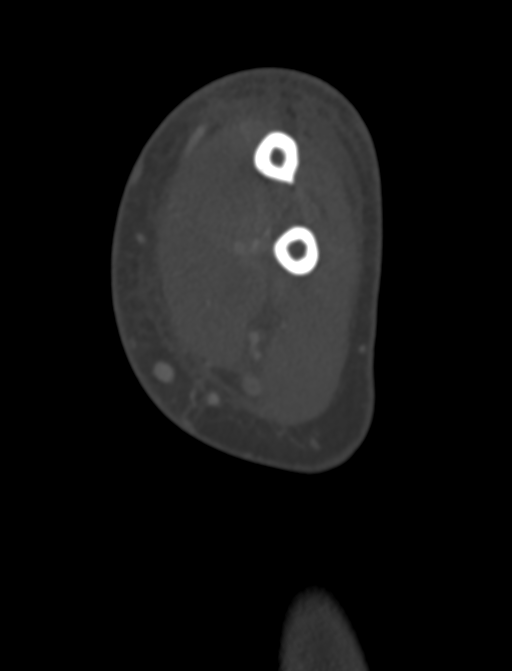
[im 70/139  soft-tissue]
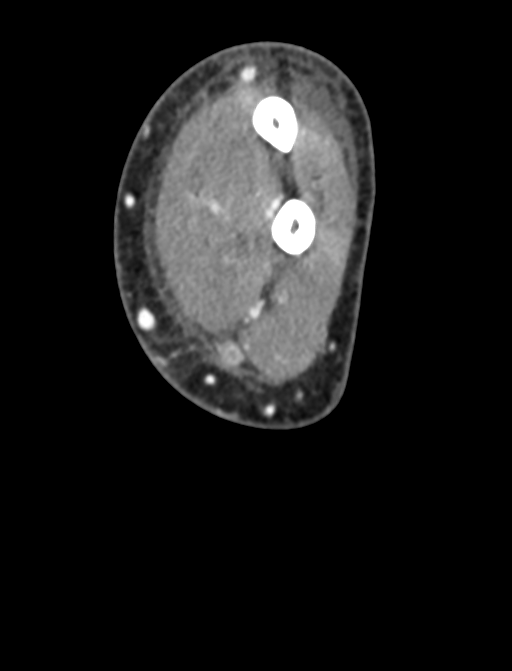
[im 70/139  bone]
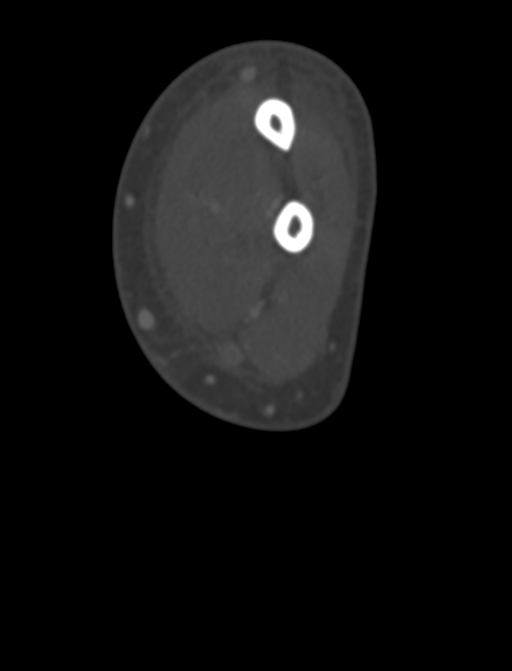
[im 81/139  bone]
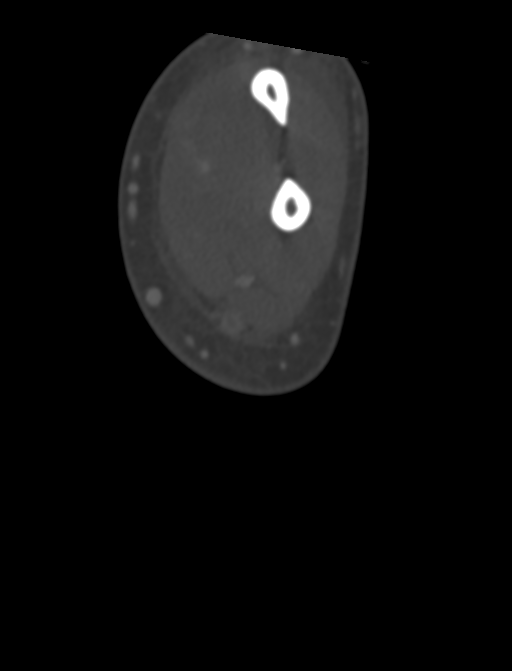
[im 93/139  bone]
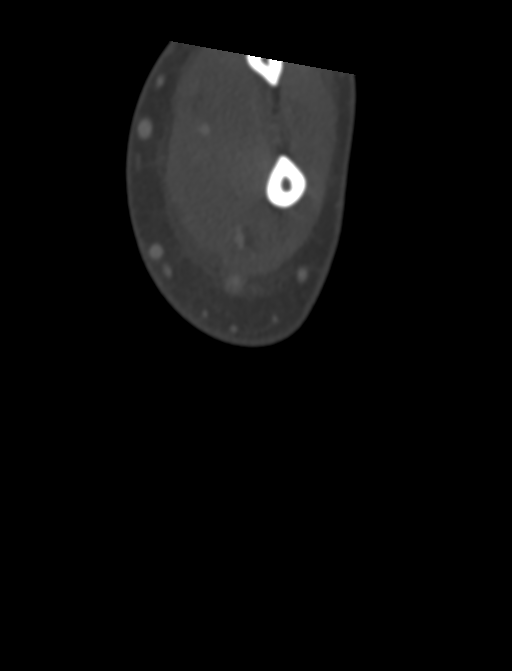

[12 of 35 positions shown; findings below may reference images not displayed]

FINDINGS: Bones/Joint/Cartilage

There is no evidence of fracture or dislocation. No osseous erosion
is seen.

Note is made of a right elbow joint effusion, with mild peripheral
enhancement, raising suspicion for septic joint.

Ligaments

Suboptimally assessed by CT.

Muscles and Tendons

An apparent tiny 1.2 x 0.8 cm intramuscular abscess is seen within
the superficial musculature at the level of the antecubital fossa.
The visualized musculature is otherwise grossly unremarkable.

The visualized tendon structures are grossly unremarkable.

Soft tissues

Mild soft tissue edema is seen tracking along the dorsum of the
forearm and elbow.

There appears to be diffuse thrombophlebitis involving multiple
veins at and above the antecubital fossa.
IMPRESSION: 1. No evidence of fracture or dislocation. No evidence of osseous
erosion.
2. Right elbow joint effusion, with mild peripheral enhancement,
raising suspicion for septic joint.
3. Apparent tiny 1.2 cm intramuscular abscess within the superficial
musculature at the level of the antecubital fossa.
4. Diffuse thrombophlebitis involving multiple veins at and above
the antecubital fossa.
5. Mild soft tissue edema tracking along the dorsum of the forearm
and elbow.

## 2017-04-15 IMAGING — CR DG CHEST 1V PORT
1 series · 1 of 1 positions shown · non-contrast
Comparison: Chest radiograph performed 06/18/2016

CLINICAL DATA: Central line placement.  Initial encounter.

EXAM:
PORTABLE CHEST 1 VIEW

[AP]
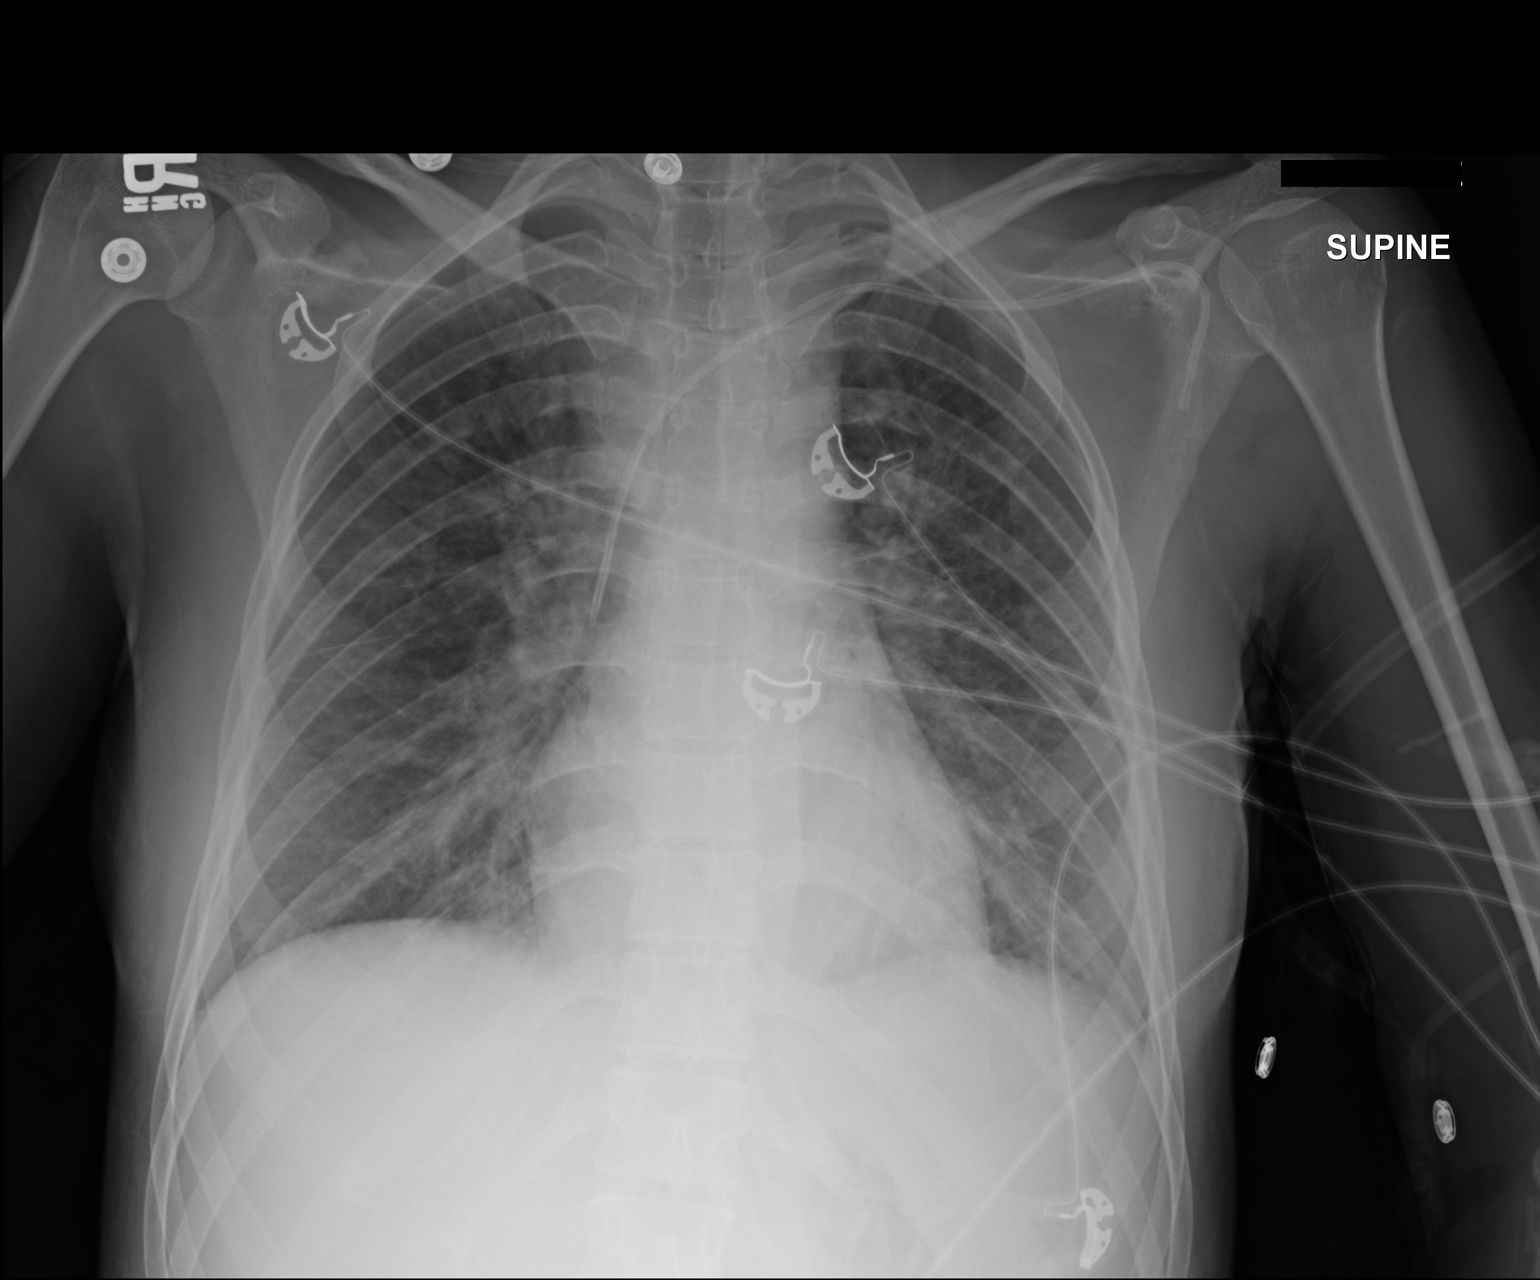

[1 of 1 positions shown; findings below may reference images not displayed]

FINDINGS: The lungs are well-aerated. Vascular congestion is noted. There is
no evidence of focal opacification, pleural effusion or
pneumothorax.

The cardiomediastinal silhouette is within normal limits. No acute
osseous abnormalities are seen. A left subclavian line is noted
ending about the mid SVC.
IMPRESSION: Vascular congestion noted.  Lungs remain grossly clear.
